# Patient Record
Sex: Female | Born: 1990 | Race: White | Hispanic: Yes | Marital: Married | Smoking: Former smoker
Health system: Southern US, Community
[De-identification: ages and names within clinical notes are randomized; demographics above are authoritative.]

## PROBLEM LIST (undated history)

## (undated) DIAGNOSIS — F329 Major depressive disorder, single episode, unspecified: Secondary | ICD-10-CM

## (undated) HISTORY — PX: APPENDECTOMY: SHX54

---
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
## 2018-05-27 ENCOUNTER — Encounter: Payer: Self-pay | Admitting: Emergency Medicine

## 2018-05-27 ENCOUNTER — Emergency Department
Admission: EM | Admit: 2018-05-27 | Discharge: 2018-05-27 | Disposition: A | Discharge status: Home / Self Care | Payer: BLUE CROSS/BLUE SHIELD | Attending: Emergency Medicine | Admitting: Emergency Medicine

## 2018-05-27 ENCOUNTER — Emergency Department: Payer: BLUE CROSS/BLUE SHIELD

## 2018-05-27 DIAGNOSIS — R0789 Other chest pain: Secondary | ICD-10-CM | POA: Diagnosis not present

## 2018-05-27 DIAGNOSIS — R079 Chest pain, unspecified: Secondary | ICD-10-CM | POA: Diagnosis not present

## 2018-05-27 DIAGNOSIS — M940 Chondrocostal junction syndrome [Tietze]: Secondary | ICD-10-CM

## 2018-05-27 LAB — BASIC METABOLIC PANEL
Anion gap: 8 (ref 5–15)
BUN: 10 mg/dL (ref 6–20)
CO2: 24 mmol/L (ref 22–32)
Calcium: 9.4 mg/dL (ref 8.9–10.3)
Chloride: 107 mmol/L (ref 98–111)
Creatinine, Ser: 0.8 mg/dL (ref 0.44–1.00)
GFR calc Af Amer: >60 mL/min (ref >60)
GFR calc non Af Amer: >60 mL/min (ref >60)
GLUCOSE: 92 mg/dL (ref 70–99)
POTASSIUM: 4.2 mmol/L (ref 3.5–5.1)
Sodium: 139 mmol/L (ref 135–145)

## 2018-05-27 LAB — CBC
HEMATOCRIT: 42.4 % (ref 35.0–47.0)
Hemoglobin: 14.8 g/dL (ref 12.0–16.0)
MCH: 30.8 pg (ref 26.0–34.0)
MCHC: 35 g/dL (ref 32.0–36.0)
MCV: 87.9 fL (ref 80.0–100.0)
Platelets: 234 10*3/uL (ref 150–440)
RBC: 4.82 MIL/uL (ref 3.80–5.20)
RDW: 13.1 % (ref 11.5–14.5)
WBC: 6.5 10*3/uL (ref 3.6–11.0)

## 2018-05-27 LAB — TROPONIN I: Troponin I: <0.03 ng/mL (ref <0.03)

## 2018-05-27 MED ORDER — DIAZEPAM 5 MG PO TABS: 5.0000 mg | ORAL_TABLET | Freq: Three times a day (TID) | ORAL | 0 refills | Status: DC | PRN

## 2018-05-27 MED ORDER — KETOROLAC TROMETHAMINE 30 MG/ML IJ SOLN
30.0000 mg | Freq: Once | INTRAMUSCULAR | Status: AC
  Administered 2018-05-27: 30 mg via INTRAVENOUS
  Filled 2018-05-27: qty 1

## 2018-05-27 MED ORDER — IBUPROFEN 800 MG PO TABS: 800.0000 mg | ORAL_TABLET | Freq: Three times a day (TID) | ORAL | 0 refills | Status: DC | PRN

## 2018-05-27 NOTE — ED Triage Notes (Signed)
Pt reports Sunday started with CP. Pt reports pain is constant with intermittent increases in severity. Pt describes the pain as shooting at times and pinching at other times and states that when you touch her chest it feels bruised. Pt also reports the pain radiates into both shoulders. Pt also reports some SOB with the pain. Pt reports ibuprofen relieves it a little but then it comes back.

## 2018-05-27 NOTE — ED Notes (Signed)
First Nurse Note: Patient complaining of chest pain since Sun. "on and off", indicates mid upper chest, describes it "as pinching unless I touch it".  Alert and oriented, NAD.  Denies injury.

## 2018-05-27 NOTE — ED Provider Notes (Signed)
[facility] Emergency Department Provider Note       Time seen: ----------------------------------------- 10:01 AM on 05/27/2018 -----------------------------------------   I have reviewed the triage vital signs and the nursing notes.  HISTORY   Chief Complaint Chest Pain and Shortness of Breath    HPI Karen Hanna is a 27 y.o. female with no significant past medical history who presents for chest pain.  Patient states pain is constant with intermittent increases in severity.  She describes it as shooting at times on the left side of her chest.  The chest wall is tender to touch, pain radiates between her shoulders.  History reviewed. No pertinent past medical history.  There are no active problems to display for this patient.   Past Surgical History:  Procedure Laterality Date  . APPENDECTOMY      Allergies Patient has no known allergies.  Social History Social History   Tobacco Use  . Smoking status: Not on file  Substance Use Topics  . Alcohol use: Not on file  . Drug use: Not on file    Review of Systems Constitutional: Negative for fever. Cardiovascular: Positive for chest pain Respiratory: Negative for shortness of breath. Gastrointestinal: Negative for abdominal pain, vomiting and diarrhea. Musculoskeletal: Negative for back pain. Skin: Negative for rash. Neurological: Negative for headaches, focal weakness or numbness.  All systems negative/normal/unremarkable except as stated in the HPI  ____________________________________________   PHYSICAL EXAM:  VITAL SIGNS: ED Triage Vitals  Enc Vitals Group     BP 05/27/18 0914 124/63     Pulse Rate 05/27/18 0914 62     Resp 05/27/18 0914 20     Temp 05/27/18 0914 98.7 F (37.1 C)     Temp Source 05/27/18 0914 Oral     SpO2 05/27/18 0914 100 %     Weight 05/27/18 0912 181 lb (82.1 kg)     Height 05/27/18 0912 5\' 5"  (1.651 m)     Head Circumference --      Peak  Flow --      Pain Score 05/27/18 0912 3     Pain Loc --      Pain Edu? --      Excl. in GC? --    Constitutional: Alert and oriented. Well appearing and in no distress. Eyes: Conjunctivae are normal. Normal extraocular movements. ENT   Head: Normocephalic and atraumatic.   Nose: No congestion/rhinnorhea.   Mouth/Throat: Mucous membranes are moist.   Neck: No stridor. Cardiovascular: Normal rate, regular rhythm. No murmurs, rubs, or gallops. Respiratory: Normal respiratory effort without tachypnea nor retractions. Breath sounds are clear and equal bilaterally. No wheezes/rales/rhonchi. Gastrointestinal: Soft and nontender. Normal bowel sounds Musculoskeletal: Nontender with normal range of motion in extremities. No lower extremity tenderness nor edema. Neurologic:  Normal speech and language. No gross focal neurologic deficits are appreciated.  Skin:  Skin is warm, dry and intact. No rash noted. Psychiatric: Mood and affect are normal. Speech and behavior are normal.  ____________________________________________  EKG: Interpreted by me.  Sinus rhythm rate 62 bpm, rightward axis, normal QT.  ____________________________________________  ED COURSE:  As part of my medical decision making, I reviewed the following data within the electronic MEDICAL RECORD NUMBER History obtained from family if available, nursing notes, old chart and ekg, as well as notes from prior ED visits. Patient presented for chest pain, we will assess with labs and imaging as indicated at this time.   Procedures ____________________________________________   LABS (pertinent positives/negatives)  Labs Reviewed  BASIC METABOLIC PANEL  CBC  TROPONIN I   RADIOLOGY Images were viewed by me  Chest x-ray IMPRESSION: Normal chest x-ray.  ____________________________________________  DIFFERENTIAL DIAGNOSIS   Costochondritis, GERD, anxiety, unstable angina unlikely  FINAL ASSESSMENT AND  PLAN  Chest wall pain   Plan: The patient had presented for atypical chest pain. Patient's labs do not reveal any acute process. Patient's imaging is also negative.  This is likely a combination of musculoskeletal pain and anxiety.  She is cleared for outpatient follow-up.   Ulice DashJohnathan E Analucia Hush, MD   Note: This note was generated in part or whole with voice recognition software. Voice recognition is usually quite accurate but there are transcription errors that can and very often do occur. I apologize for any typographical errors that were not detected and corrected.     Emily FilbertWilliams, Ursala Cressy E, MD 05/27/18 1104

## 2018-06-24 ENCOUNTER — Ambulatory Visit: Payer: Self-pay | Admitting: Nurse Practitioner

## 2018-06-30 ENCOUNTER — Encounter: Payer: Self-pay | Admitting: Nurse Practitioner

## 2018-06-30 ENCOUNTER — Ambulatory Visit (INDEPENDENT_AMBULATORY_CARE_PROVIDER_SITE_OTHER): Payer: BLUE CROSS/BLUE SHIELD | Admitting: Nurse Practitioner

## 2018-06-30 ENCOUNTER — Other Ambulatory Visit: Payer: Self-pay

## 2018-06-30 VITALS — BP 110/65 | HR 63 | Temp 98.9°F | Ht 65.0 in | Wt 181.2 lb

## 2018-06-30 DIAGNOSIS — F419 Anxiety disorder, unspecified: Secondary | ICD-10-CM | POA: Diagnosis not present

## 2018-06-30 DIAGNOSIS — F410 Panic disorder [episodic paroxysmal anxiety] without agoraphobia: Secondary | ICD-10-CM | POA: Diagnosis not present

## 2018-06-30 DIAGNOSIS — F32A Depression, unspecified: Secondary | ICD-10-CM

## 2018-06-30 DIAGNOSIS — F329 Major depressive disorder, single episode, unspecified: Secondary | ICD-10-CM | POA: Diagnosis not present

## 2018-06-30 DIAGNOSIS — Z7689 Persons encountering health services in other specified circumstances: Secondary | ICD-10-CM | POA: Diagnosis not present

## 2018-06-30 MED ORDER — HYDROXYZINE HCL 10 MG PO TABS: 10.0000 mg | ORAL_TABLET | Freq: Three times a day (TID) | ORAL | 0 refills | Status: DC | PRN

## 2018-06-30 MED ORDER — ESCITALOPRAM OXALATE 10 MG PO TABS: 10.0000 mg | ORAL_TABLET | Freq: Every day | ORAL | 2 refills | Status: DC

## 2018-06-30 NOTE — Patient Instructions (Addendum)
Karen Hanna,   Thank you for coming in to clinic today.  1.  START escitalopram (Lexapro) 10 mg tablet.  Take 1/2 tablet once daily for 7 days.  Then, increase to 1 full tablet once daily and continue.   2. PSYCHIATRY / THERAPY-COUSENLING PSYCH COUNSELING ONLY Self Referral: 1. [facility] [place] [facility]   Address: [address] [place], [place], [place] [address] Hours: Open today  9AM-7PM Phone: [phone]  2. Anell Barr [employer]  - [facility] Address: [address] [place], [place] [address] Phone: [phone]  [name], LCSW  [facility] Referral is placed, they will call you to schedule.  If you prefer a self referral counselor, decline their appointment.  Please schedule a follow-up appointment with Wilhelmina Mcardle, AGNP. Return in about 6 weeks (around 08/11/2018) for depression, anxiety.  If you have any other questions or concerns, please feel free to call the clinic or send a message through MyChart. You may also schedule an earlier appointment if necessary.  You will receive a survey after today's visit either digitally by e-mail or paper by [employer]. Your experiences and feedback matter to [place].  Please respond so we know how we are doing as we provide care for you.   Wilhelmina Mcardle, DNP, AGNP-BC Adult Gerontology Nurse Practitioner [facility], [facility]
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
## 2018-07-01 ENCOUNTER — Telehealth: Payer: Self-pay | Admitting: Nurse Practitioner

## 2018-07-01 NOTE — Telephone Encounter (Signed)
The pt called with concerns about how drowsy she is since taking the Hydroxyzine this morning. She did admit she didn't start the Escitalopram yet.

## 2018-07-01 NOTE — Telephone Encounter (Signed)
She should only take 1/2 tablet per dose if she took whole tab.  Also, did she take at bedtime or during the day.  It may be something she only takes at bedtime.

## 2018-07-01 NOTE — Telephone Encounter (Signed)
Pt has a question about hydroxyzine [phone]

## 2018-07-02 NOTE — Telephone Encounter (Signed)
Try calling patient got voice recording "mailbox is full can't accept any messages at this time."

## 2018-07-03 NOTE — Telephone Encounter (Signed)
Attempted to contact the pt, no answer.  

## 2018-07-18 ENCOUNTER — Telehealth: Payer: Self-pay | Admitting: Nurse Practitioner

## 2018-07-18 DIAGNOSIS — F419 Anxiety disorder, unspecified: Secondary | ICD-10-CM

## 2018-07-18 DIAGNOSIS — F410 Panic disorder [episodic paroxysmal anxiety] without agoraphobia: Secondary | ICD-10-CM

## 2018-07-18 DIAGNOSIS — F329 Major depressive disorder, single episode, unspecified: Secondary | ICD-10-CM

## 2018-07-18 MED ORDER — HYDROXYZINE HCL 10 MG PO TABS: 10.0000 mg | ORAL_TABLET | Freq: Three times a day (TID) | ORAL | 0 refills | Status: DC | PRN

## 2018-07-18 NOTE — Telephone Encounter (Signed)
Previous messages states it makes her drowsy and now patient is requesting hydroxyzine for Anxiety. As per patient the drowsiness last for only 2 days and then rest of the days she was fine.

## 2018-07-18 NOTE — Telephone Encounter (Signed)
Pt. Called requesting refill on hydroxyzine  Called into [facility]. Pt call back # is  [phone]

## 2018-07-18 NOTE — Telephone Encounter (Signed)
Covering inbox for Karen Hanna, AGPCNP-BC while she is out of office.  Reviewed chart. Agreed to refill Hydroxyzine 10mg  TID PRN - sent same rx as previously sent for #30 pills for now, up to 10 day supply, maybe longer if using less often. Will forward to PCP for future follow-up if agreeable may prescribe larger amount of Hydroxyzine if indicated or other change to therapy.  Saralyn Pilar, DO [facility] 07/18/2018, 5:38 PM

## 2018-07-22 ENCOUNTER — Other Ambulatory Visit: Payer: Self-pay | Admitting: Nurse Practitioner

## 2018-07-22 DIAGNOSIS — F419 Anxiety disorder, unspecified: Secondary | ICD-10-CM

## 2018-07-22 DIAGNOSIS — F329 Major depressive disorder, single episode, unspecified: Secondary | ICD-10-CM

## 2018-07-22 DIAGNOSIS — F410 Panic disorder [episodic paroxysmal anxiety] without agoraphobia: Secondary | ICD-10-CM

## 2018-07-22 DIAGNOSIS — F32A Depression, unspecified: Secondary | ICD-10-CM

## 2018-08-01 ENCOUNTER — Other Ambulatory Visit: Payer: Self-pay | Admitting: Nurse Practitioner

## 2018-08-01 DIAGNOSIS — F410 Panic disorder [episodic paroxysmal anxiety] without agoraphobia: Secondary | ICD-10-CM

## 2018-08-01 DIAGNOSIS — F329 Major depressive disorder, single episode, unspecified: Secondary | ICD-10-CM

## 2018-08-01 DIAGNOSIS — F419 Anxiety disorder, unspecified: Secondary | ICD-10-CM

## 2018-08-01 MED ORDER — HYDROXYZINE HCL 10 MG PO TABS: 10.0000 mg | ORAL_TABLET | Freq: Three times a day (TID) | ORAL | 0 refills | Status: DC | PRN

## 2018-08-01 NOTE — Telephone Encounter (Signed)
Pt called requesting refill on hydroxy ine called into [facility]

## 2018-08-13 ENCOUNTER — Encounter: Payer: Self-pay | Admitting: Nurse Practitioner

## 2018-08-13 ENCOUNTER — Other Ambulatory Visit: Payer: Self-pay

## 2018-08-13 ENCOUNTER — Ambulatory Visit (INDEPENDENT_AMBULATORY_CARE_PROVIDER_SITE_OTHER): Payer: BLUE CROSS/BLUE SHIELD | Admitting: Nurse Practitioner

## 2018-08-13 DIAGNOSIS — F410 Panic disorder [episodic paroxysmal anxiety] without agoraphobia: Secondary | ICD-10-CM | POA: Diagnosis not present

## 2018-08-13 DIAGNOSIS — F329 Major depressive disorder, single episode, unspecified: Secondary | ICD-10-CM | POA: Diagnosis not present

## 2018-08-13 DIAGNOSIS — F419 Anxiety disorder, unspecified: Secondary | ICD-10-CM

## 2018-08-13 DIAGNOSIS — F32A Depression, unspecified: Secondary | ICD-10-CM

## 2018-08-13 MED ORDER — HYDROXYZINE HCL 25 MG PO TABS: 12.5000 mg | ORAL_TABLET | Freq: Three times a day (TID) | ORAL | 1 refills | Status: DC | PRN

## 2018-08-13 MED ORDER — ESCITALOPRAM OXALATE 20 MG PO TABS: 20.0000 mg | ORAL_TABLET | Freq: Every day | ORAL | 2 refills | Status: DC

## 2018-08-13 NOTE — Progress Notes (Signed)
Subjective:    Patient ID: Karen Hanna, female    DOB: 1991/07/23, 27 y.o.   MRN: [id number]  Karen Hanna is a 27 y.o. female presenting on 08/13/2018 for Anxiety (depression)   HPI Anxiety Is having some "random outbursts of 'I wish I were dead thoughts' that just cross my head." Feels less spurts of manic energy and is having difficulty focusing on things.  Feels she may have ADHD.  Had been told by her teachers in past that she needed to have  - Picking skin (fingers, lips, toes) and doesn't notice still she is bleeding or hurting. - Hydroxyzine has helped some with anxiety, but feels like she needs more.  - Is working on self-soothing mechanisms. - Much less irritation  GAD 7 : Generalized Anxiety Score 08/13/2018 06/30/2018  Nervous, Anxious, on Edge 2 3  Control/stop worrying 2 2  Worry too much - different things 1 2  Trouble relaxing 2 2  Restless 2 2  Easily annoyed or irritable 0 2  Afraid - awful might happen 1 3  Total GAD 7 Score 10 16  Anxiety Difficulty Somewhat difficult Very difficult     Depression screen [facility] 2/9 08/13/2018 06/30/2018  Decreased Interest 1 2  Down, Depressed, Hopeless 1 2  PHQ - 2 Score 2 4  Altered sleeping 2 3  Tired, decreased energy 2 3  Change in appetite 2 3  Feeling bad or failure about yourself  1 1  Trouble concentrating 3 1  Moving slowly or fidgety/restless 1 3  Suicidal thoughts 2 1  PHQ-9 Score 15 19  Difficult doing work/chores Somewhat difficult Very difficult    Social History   Tobacco Use  . Smoking status: Former Smoker    Years: 11.00    Last attempt to quit: 01/28/2018    Years since quitting: 0.5  . Smokeless tobacco: Never Used  Substance Use Topics  . Alcohol use: Yes    Alcohol/week: 1.0 standard drinks    Types: 1 Glasses of wine per week  . Drug use: Never    Review of Systems Per HPI unless specifically indicated above     Objective:    BP 115/60 (BP Location: Right Arm,  Patient Position: Sitting, Cuff Size: Normal)   Pulse 62   Temp 98.3 F (36.8 C) (Oral)   Ht 5\' 5"  (1.651 m)   Wt 179 lb 12.8 oz (81.6 kg)   BMI 29.92 kg/m   Wt Readings from Last 3 Encounters:  08/13/18 179 lb 12.8 oz (81.6 kg)  06/30/18 181 lb 3.2 oz (82.2 kg)  05/27/18 181 lb (82.1 kg)    Physical Exam  Constitutional: She is oriented to person, place, and time. She appears well-developed and well-nourished. No distress.  HENT:  Head: Normocephalic and atraumatic.  Cardiovascular: Normal rate, regular rhythm, S1 normal, S2 normal, normal heart sounds and intact distal pulses.  Pulmonary/Chest: Effort normal and breath sounds normal. No respiratory distress.  Neurological: She is alert and oriented to person, place, and time.  Skin: Skin is warm and dry.  Psychiatric: Her behavior is normal. Her mood appears anxious. Her speech is rapid and/or pressured.  Vitals reviewed.   Results for orders placed or performed during the hospital encounter of 05/27/18  Basic metabolic panel  Result Value Ref Range   Sodium 139 135 - 145 mmol/L   Potassium 4.2 3.5 - 5.1 mmol/L   Chloride 107 98 - 111 mmol/L   CO2 24  22 - 32 mmol/L   Glucose, Bld 92 70 - 99 mg/dL   BUN 10 6 - 20 mg/dL   Creatinine, Ser 2.950.80 0.44 - 1.00 mg/dL   Calcium 9.4 8.9 - 62.110.3 mg/dL   GFR calc non Af Amer >60 >60 mL/min   GFR calc Af Amer >60 >60 mL/min   Anion gap 8 5 - 15  CBC  Result Value Ref Range   WBC 6.5 3.6 - 11.0 K/uL   RBC 4.82 3.80 - 5.20 MIL/uL   Hemoglobin 14.8 12.0 - 16.0 g/dL   HCT 30.842.4 65.735.0 - 84.647.0 %   MCV 87.9 80.0 - 100.0 fL   MCH 30.8 26.0 - 34.0 pg   MCHC 35.0 32.0 - 36.0 g/dL   RDW 96.213.1 95.211.5 - 84.114.5 %   Platelets 234 150 - 440 K/uL  Troponin I  Result Value Ref Range   Troponin I <0.03 <0.03 ng/mL      Assessment & Plan:   Problem List Items Addressed This Visit    None    Visit Diagnoses    Panic       Relevant Medications   hydrOXYzine (ATARAX/VISTARIL) 25 MG tablet    escitalopram (LEXAPRO) 20 MG tablet   Anxiety and depression       Relevant Medications   hydrOXYzine (ATARAX/VISTARIL) 25 MG tablet   escitalopram (LEXAPRO) 20 MG tablet      # Anxiety and depression, panic disorder Patient with acute anxiety and panic, disrupting quality of life and relationships in past, now improving.  Patient also has concern for ADHD and would like testing now that she has less anxiety and decreased attention..  Plan: 1. Provided resources for ADHD testing. 2. Increase lexapro to 20 mg once daily 3. Take hydroxyzine 1/2-1 tab tid prn anxiety and sleep. 4. Follow up 2 months.  Meds ordered this encounter  Medications  . hydrOXYzine (ATARAX/VISTARIL) 25 MG tablet    Sig: Take 0.5-1 tablets (12.5-25 mg total) by mouth 3 (three) times daily as needed for anxiety.    Dispense:  60 tablet    Refill:  1    Order Specific Question:   Supervising Provider    Answer:   Smitty CordsKARAMALEGOS, ALEXANDER J [[id number]]  . escitalopram (LEXAPRO) 20 MG tablet    Sig: Take 1 tablet (20 mg total) by mouth daily.    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  Wilhelmina McardleLauren Sharlene Mccluskey, DNP, AGPCNP-BC Adult Gerontology Primary Care Nurse Practitioner [facility] 08/13/2018, 9:11 AM

## 2018-08-13 NOTE — Patient Instructions (Addendum)
Karen LungerPaola A Barrezueta Hanna,   Thank you for coming in to clinic today.  1. You can get your ADHD testing done at any of the following locations: 1. Drema BalzarineBruce R Thompson PHD, local Psychologist  [address]  [place], [place] [address]  [phone]  2. [facility]  [address]   [place], [place] [address]   Phone [phone]   3. [facility] (will also do medication management)  Ground Floor of the [facility] just off the lobby  [address]  [place], [place] [address]  Phone: [phone]   2. INCREASE lexapro to 20 mg once daily.  Take with food in morning.   3. CHANGE hydroxyzine to 25 mg tablet.  Take 1/2 - 1 tablet up to three times daily as needed for anxiety and/or sleep at bedtime.  Please schedule a follow-up appointment with Wilhelmina McardleLauren Luismiguel Lamere, AGNP. Return in about 2 months (around 10/13/2018).  If you have any other questions or concerns, please feel free to call the clinic or send a message through MyChart. You may also schedule an earlier appointment if necessary.  You will receive a survey after today's visit either digitally by e-mail or paper by [employer] mail. Your experiences and feedback matter to us.  Please respond so we know how we are doing as we provide care for you.   Wilhelmina McardleLauren Mayda Shippee, DNP, AGNP-BC Adult Gerontology Nurse Practitioner [facility], [facility]

## 2018-08-21 ENCOUNTER — Encounter: Payer: Self-pay | Admitting: Nurse Practitioner

## 2018-09-07 ENCOUNTER — Other Ambulatory Visit: Payer: Self-pay | Admitting: Nurse Practitioner

## 2018-09-07 DIAGNOSIS — F329 Major depressive disorder, single episode, unspecified: Secondary | ICD-10-CM

## 2018-09-07 DIAGNOSIS — F32A Depression, unspecified: Secondary | ICD-10-CM

## 2018-09-07 DIAGNOSIS — F419 Anxiety disorder, unspecified: Secondary | ICD-10-CM

## 2018-09-07 DIAGNOSIS — F410 Panic disorder [episodic paroxysmal anxiety] without agoraphobia: Secondary | ICD-10-CM

## 2018-09-16 ENCOUNTER — Other Ambulatory Visit: Payer: Self-pay | Admitting: Nurse Practitioner

## 2018-09-16 DIAGNOSIS — F410 Panic disorder [episodic paroxysmal anxiety] without agoraphobia: Secondary | ICD-10-CM

## 2018-09-16 DIAGNOSIS — F419 Anxiety disorder, unspecified: Secondary | ICD-10-CM

## 2018-09-16 DIAGNOSIS — F329 Major depressive disorder, single episode, unspecified: Secondary | ICD-10-CM

## 2018-09-16 NOTE — Telephone Encounter (Signed)
Refilled on 09/08/2018.  Patient should call her pharmacy.

## 2018-09-16 NOTE — Telephone Encounter (Signed)
Pt needs a refill on [facility]apro [name]

## 2018-09-18 NOTE — Telephone Encounter (Signed)
Left message for Rx being send and patient can call back for detail.

## 2018-10-13 ENCOUNTER — Ambulatory Visit: Payer: BLUE CROSS/BLUE SHIELD | Admitting: Nurse Practitioner

## 2018-10-22 ENCOUNTER — Other Ambulatory Visit: Payer: Self-pay | Admitting: Nurse Practitioner

## 2018-10-22 DIAGNOSIS — F419 Anxiety disorder, unspecified: Secondary | ICD-10-CM

## 2018-10-22 DIAGNOSIS — F329 Major depressive disorder, single episode, unspecified: Secondary | ICD-10-CM

## 2018-10-22 DIAGNOSIS — F410 Panic disorder [episodic paroxysmal anxiety] without agoraphobia: Secondary | ICD-10-CM

## 2018-10-22 NOTE — Telephone Encounter (Signed)
Refill is not due. Last sent on 09/07/2009 for 90-day supply.  Patient also missed her last appointment and will need to reschedule.

## 2018-10-30 ENCOUNTER — Encounter: Payer: Self-pay | Admitting: Nurse Practitioner

## 2018-10-30 ENCOUNTER — Other Ambulatory Visit: Payer: Self-pay

## 2018-10-30 ENCOUNTER — Ambulatory Visit (INDEPENDENT_AMBULATORY_CARE_PROVIDER_SITE_OTHER): Payer: BLUE CROSS/BLUE SHIELD | Admitting: Nurse Practitioner

## 2018-10-30 VITALS — BP 117/64 | HR 63 | Temp 98.4°F | Resp 16 | Ht 65.0 in | Wt 190.6 lb

## 2018-10-30 DIAGNOSIS — R6889 Other general symptoms and signs: Secondary | ICD-10-CM | POA: Diagnosis not present

## 2018-10-30 DIAGNOSIS — R6883 Chills (without fever): Secondary | ICD-10-CM | POA: Diagnosis not present

## 2018-10-30 DIAGNOSIS — R000 Tachycardia, unspecified: Secondary | ICD-10-CM

## 2018-10-30 DIAGNOSIS — J40 Bronchitis, not specified as acute or chronic: Secondary | ICD-10-CM | POA: Diagnosis not present

## 2018-10-30 LAB — POCT INFLUENZA A/B
Influenza A, POC: NEGATIVE
Influenza B, POC: NEGATIVE

## 2018-10-30 MED ORDER — ALBUTEROL SULFATE HFA 108 (90 BASE) MCG/ACT IN AERS: 1.0000 | INHALATION_SPRAY | Freq: Four times a day (QID) | RESPIRATORY_TRACT | 2 refills | Status: DC | PRN

## 2018-10-30 NOTE — Patient Instructions (Signed)
Karen Hanna,   Thank you for coming in to clinic today.  1. Your flu test was NEGATIVE.  It is possible you can still have the flu with a negative test, otherwise it could be a different virus causing your symptoms. - Wash hands and cover cough very well to avoid spread of infection - For symptom control:      - Take Ibuprofen / Advil 400-600mg  every 6-8 hours as needed for fever / muscle aches.  You may also take Tylenol 500-1000mg  per dose every 6-8 hours or 3 times a day.  You can alternate dosing and take both in the same day.      - Do not take more than 3,000 mg acetaminophen (Tylenol) in a day.      - START albuterol 1-2 puffs every 4-6 hours as needed.      - Start OTC Mucinex-DM for cough and congestion for up to 7 days.  If taking cold and flu do not take mucinex DM. - Improve hydration with plenty of clear fluids.  Drink up to 8 glasses of water / fluids each day.  If significant worsening with poor fluid intake, worsening fever, difficulty breathing due to coughing, worsening body aches, weakness, or other more concerning symptoms difficulty breathing you can seek treatment at Emergency Department. If your flu symptoms have improved and then get worse several days to a week later with concerns for bronchitis, productive cough, fever, and chills we may need to check for possible pneumonia that can occur after the flu.  Please schedule a follow-up appointment with Wilhelmina Mcardle, AGNP in 1-2 weeks as needed if worsening from Flu / Bronchitis.  If you have any other questions or concerns, please feel free to call the clinic or send a message through MyChart. You may also schedule an earlier appointment if necessary.  You will receive a survey after today's visit either digitally by e-mail or paper by [employer]. Your experiences and feedback matter to [place].  Please respond so we know how we are doing as we provide care for you.   Wilhelmina Mcardle, DNP, AGNP-BC Adult  Gerontology Nurse Practitioner [facility], [facility]

## 2018-10-30 NOTE — Progress Notes (Signed)
Subjective:    Patient ID: Karen Hanna, female    DOB: [birth year], 28 y.o.   MRN: [id number]  Karen Hanna is a 28 y.o. female presenting on 10/30/2018 for Influenza (sore throat, cough, chills, bodyache onset 4 days coworkers tested positive for flu)   HPI Flu-like symptoms Started feeling poorly Saturday (4 days ago).  Then went to work Monday with sneezing, coughing and was sent home. Since then, has had night sweats, chills, hot flashes cyclically.  Now having body aches, fatigue/malaise, nasal congestion.  Feels some chest congestion.  Has had mild nausea, but is able to keep food/drink on stomach.  No vomiting.  States she feels difficulty breathing with chest pressure at night. - Has had positive flu contacts.   Social History   Tobacco Use  . Smoking status: Former Smoker    Years: 11.00    Last attempt to quit: 01/28/2018    Years since quitting: 0.7  . Smokeless tobacco: Never Used  Substance Use Topics  . Alcohol use: Yes    Alcohol/week: 1.0 standard drinks    Types: 1 Glasses of wine per week  . Drug use: Never    Review of Systems Per HPI unless specifically indicated above     Objective:    BP 117/64   Pulse 63   Temp 98.4 F (36.9 C) (Oral)   Resp 16   Ht 5\' 5"  (1.651 m)   Wt 190 lb 9.6 oz (86.5 kg)   SpO2 100%   BMI 31.72 kg/m   Wt Readings from Last 3 Encounters:  10/30/18 190 lb 9.6 oz (86.5 kg)  08/13/18 179 lb 12.8 oz (81.6 kg)  06/30/18 181 lb 3.2 oz (82.2 kg)    Physical Exam Vitals signs reviewed.  Constitutional:      General: She is not in acute distress.    Appearance: She is well-developed. She is ill-appearing. She is not diaphoretic.  HENT:     Head: Normocephalic and atraumatic.     Right Ear: Hearing, tympanic membrane, ear canal and external ear normal.     Left Ear: Hearing, tympanic membrane, ear canal and external ear normal.     Nose: Mucosal edema and rhinorrhea present.     Right Sinus: No maxillary  sinus tenderness or frontal sinus tenderness.     Left Sinus: No maxillary sinus tenderness or frontal sinus tenderness.     Mouth/Throat:     Lips: Pink.     Mouth: Mucous membranes are moist.     Pharynx: Oropharynx is clear. No pharyngeal swelling, oropharyngeal exudate or posterior oropharyngeal erythema.     Tonsils: Swelling: 1+ on the right. 1+ on the left.  Cardiovascular:     Rate and Rhythm: Normal rate and regular rhythm.     Pulses:          Radial pulses are 2+ on the right side and 2+ on the left side.       Posterior tibial pulses are 1+ on the right side and 1+ on the left side.     Heart sounds: Normal heart sounds, S1 normal and S2 normal.  Pulmonary:     Effort: Pulmonary effort is normal. No respiratory distress.     Breath sounds: Normal air entry. Wheezing present.  Musculoskeletal:     Right lower leg: No edema.     Left lower leg: No edema.  Skin:    General: Skin is warm and dry.  Capillary Refill: Capillary refill takes less than 2 seconds.  Neurological:     Mental Status: She is alert and oriented to person, place, and time.  Psychiatric:        Attention and Perception: Attention normal.        Mood and Affect: Mood and affect normal.        Behavior: Behavior normal. Behavior is cooperative.        Thought Content: Thought content normal.        Judgment: Judgment normal.     Results for orders placed or performed during the hospital encounter of 05/27/18  Basic metabolic panel  Result Value Ref Range   Sodium 139 135 - 145 mmol/L   Potassium 4.2 3.5 - 5.1 mmol/L   Chloride 107 98 - 111 mmol/L   CO2 24 22 - 32 mmol/L   Glucose, Bld 92 70 - 99 mg/dL   BUN 10 6 - 20 mg/dL   Creatinine, Ser 1.610.80 0.44 - 1.00 mg/dL   Calcium 9.4 8.9 - 09.610.3 mg/dL   GFR calc non Af Amer >60 >60 mL/min   GFR calc Af Amer >60 >60 mL/min   Anion gap 8 5 - 15  CBC  Result Value Ref Range   WBC 6.5 3.6 - 11.0 K/uL   RBC 4.82 3.80 - 5.20 MIL/uL   Hemoglobin 14.8  12.0 - 16.0 g/dL   HCT 04.542.4 40.935.0 - 81.147.0 %   MCV 87.9 80.0 - 100.0 fL   MCH 30.8 26.0 - 34.0 pg   MCHC 35.0 32.0 - 36.0 g/dL   RDW 91.413.1 78.211.5 - 95.614.5 %   Platelets 234 150 - 440 K/uL  Troponin I  Result Value Ref Range   Troponin I <0.03 <0.03 ng/mL      Assessment & Plan:   Problem List Items Addressed This Visit    None    Visit Diagnoses    Chills    -  Primary   Relevant Orders   POCT Influenza A/B (Completed)   Bronchitis       Relevant Medications   albuterol (PROVENTIL HFA;VENTOLIN HFA) 108 (90 Base) MCG/ACT inhaler   Flu-like symptoms       Relevant Medications   albuterol (PROVENTIL HFA;VENTOLIN HFA) 108 (90 Base) MCG/ACT inhaler     Clinically diagnosed influenza without flu test today given time from symptom onset and known exposure to positive influenza. - Duration x 4 days, without complication. Tolerating PO and well hydrated - No other focal findings of infection today - did not receive influenza vaccine this season  Plan: 1. Not able to start antiviral therapy due to time from onset of symptoms.  Now at point that symptoms will begin resolving. - START albuterol for shortness of breath, chest discomfort with breathing - likely bronchitis. 2. Supportive care as advised with NSAID / Tylenol PRN fever/myalgias, improve hydration, may take OTC Cold/Flu meds 3. Return criteria given if significant worsening, consider post-influenza complications, otherwise follow-up if needed    Meds ordered this encounter  Medications  . albuterol (PROVENTIL HFA;VENTOLIN HFA) 108 (90 Base) MCG/ACT inhaler    Sig: Inhale 1-2 puffs into the lungs every 6 (six) hours as needed for wheezing or shortness of breath.    Dispense:  1 Inhaler    Refill:  2    Product selection permitted for insurance/patient brand preference    Order Specific Question:   Supervising Provider    Answer:   Smitty CordsKARAMALEGOS, ALEXANDER J [[id number]]  Follow up plan: Follow-up 1-2 weeks prn.  Concern for  monitoring for bronchits, pneumonia.  Wilhelmina Mcardle, DNP, AGPCNP-BC Adult Gerontology Primary Care Nurse Practitioner [facility] 10/30/2018, 8:41 AM

## 2018-10-31 ENCOUNTER — Encounter: Payer: Self-pay | Admitting: Nurse Practitioner

## 2018-10-31 NOTE — Progress Notes (Signed)
Duplicate vist.

## 2018-12-12 ENCOUNTER — Other Ambulatory Visit: Payer: Self-pay | Admitting: Nurse Practitioner

## 2018-12-12 DIAGNOSIS — F32A Depression, unspecified: Secondary | ICD-10-CM

## 2018-12-12 DIAGNOSIS — F419 Anxiety disorder, unspecified: Secondary | ICD-10-CM

## 2018-12-12 DIAGNOSIS — F410 Panic disorder [episodic paroxysmal anxiety] without agoraphobia: Secondary | ICD-10-CM

## 2018-12-12 DIAGNOSIS — F329 Major depressive disorder, single episode, unspecified: Secondary | ICD-10-CM

## 2018-12-12 NOTE — Telephone Encounter (Signed)
Pharmacy requesting refills. Thanks!  

## 2019-01-09 ENCOUNTER — Other Ambulatory Visit: Payer: Self-pay | Admitting: Nurse Practitioner

## 2019-01-09 DIAGNOSIS — F410 Panic disorder [episodic paroxysmal anxiety] without agoraphobia: Secondary | ICD-10-CM

## 2019-01-09 DIAGNOSIS — F329 Major depressive disorder, single episode, unspecified: Secondary | ICD-10-CM

## 2019-01-09 DIAGNOSIS — F419 Anxiety disorder, unspecified: Secondary | ICD-10-CM

## 2019-01-12 ENCOUNTER — Other Ambulatory Visit: Payer: Self-pay

## 2019-01-12 ENCOUNTER — Ambulatory Visit: Payer: BLUE CROSS/BLUE SHIELD | Admitting: Nurse Practitioner

## 2019-01-14 ENCOUNTER — Encounter: Payer: Self-pay | Admitting: Nurse Practitioner

## 2019-01-14 ENCOUNTER — Other Ambulatory Visit: Payer: Self-pay

## 2019-01-14 ENCOUNTER — Ambulatory Visit (INDEPENDENT_AMBULATORY_CARE_PROVIDER_SITE_OTHER): Payer: BLUE CROSS/BLUE SHIELD | Admitting: Nurse Practitioner

## 2019-01-14 DIAGNOSIS — F5104 Psychophysiologic insomnia: Secondary | ICD-10-CM

## 2019-01-14 DIAGNOSIS — F329 Major depressive disorder, single episode, unspecified: Secondary | ICD-10-CM

## 2019-01-14 DIAGNOSIS — F410 Panic disorder [episodic paroxysmal anxiety] without agoraphobia: Secondary | ICD-10-CM

## 2019-01-14 DIAGNOSIS — F419 Anxiety disorder, unspecified: Secondary | ICD-10-CM

## 2019-01-14 DIAGNOSIS — F32A Depression, unspecified: Secondary | ICD-10-CM

## 2019-01-14 MED ORDER — TRAZODONE HCL 50 MG PO TABS: 25.0000 mg | ORAL_TABLET | Freq: Every evening | ORAL | 1 refills | Status: DC | PRN

## 2019-01-14 MED ORDER — ESCITALOPRAM OXALATE 20 MG PO TABS: 20.0000 mg | ORAL_TABLET | Freq: Every day | ORAL | 5 refills | Status: DC

## 2019-01-14 NOTE — Progress Notes (Signed)
Telemedicine Encounter: Disclosed to patient at start of encounter that we will provide appropriate telemedicine services.  Patient consents to be treated via phone prior to discussion. - Patient is at her home and is accessed via telephone. - Services are provided by Wilhelmina Mcardle from [facility].   Subjective:    Patient ID: Karen Hanna, female    DOB: June 02, 1991, 28 y.o.   MRN: [id number]  Zanovia Ugalde is a 28 y.o. female presenting on 01/14/2019 for Anxiety  HPI Anxiety Patient was prescribed escitalopram 20 mg daily with good response.  Notes no irritability or getting down.  If she misses it she notes the next day is worse with irritable and lack of motivation, hard to control emotions. - Patient has not been out of medications - Patient has been home and has been different - coping skills - has a harder time focusing to write.   - Patient is still having a really hard time sleeping. - Only uses hydroxyzine when she has "freak out" - still helps "a little" - SI: blames this on missing meds.  Has episode of missing meds.  Thinks of ways of ending her life.  Thought process of planning.  Never has fully developed a plan.    Depression screen [facility] 2/9 01/14/2019 10/30/2018 08/13/2018 06/30/2018  Decreased Interest 2 2 1 2   Down, Depressed, Hopeless 3 2 1 2   PHQ - 2 Score 5 4 2 4   Altered sleeping 3 3 2 3   Tired, decreased energy 3 2 2 3   Change in appetite 3 3 2 3   Feeling bad or failure about yourself  3 2 1 1   Trouble concentrating 3 1 3 1   Moving slowly or fidgety/restless 0 2 1 3   Suicidal thoughts 3 2 2 1   PHQ-9 Score 23 19 15 19   Difficult doing work/chores Somewhat difficult Somewhat difficult Somewhat difficult Very difficult    GAD 7 : Generalized Anxiety Score 01/14/2019 10/30/2018 08/13/2018 06/30/2018  Nervous, Anxious, on Edge 1 3 2 3   Control/stop worrying 1 2 2 2   Worry too much - different things 1 3 1 2   Trouble relaxing 1 2  2 2   Restless 3 2 2 2   Easily annoyed or irritable 1 2 0 2  Afraid - awful might happen 1 2 1 3   Total GAD 7 Score 9 16 10 16   Anxiety Difficulty Very difficult Somewhat difficult Somewhat difficult Very difficult    Social History   Tobacco Use  . Smoking status: Former Smoker    Years: 11.00    Last attempt to quit: 01/28/2018    Years since quitting: 0.9  . Smokeless tobacco: Never Used  Substance Use Topics  . Alcohol use: Yes    Alcohol/week: 1.0 standard drinks    Types: 1 Glasses of wine per week  . Drug use: Never   Review of Systems Per HPI unless specifically indicated above    Objective:    There were no vitals taken for this visit.   Wt Readings from Last 3 Encounters:  10/30/18 190 lb 9.6 oz (86.5 kg)  10/30/18 190 lb 9.6 oz (86.5 kg)  08/13/18 179 lb 12.8 oz (81.6 kg)    Physical Exam Patient remotely monitored.  Verbal communication appropriate.  Cognition normal.   Results for orders placed or performed in visit on 10/30/18  POCT Influenza A/B  Result Value Ref Range   Influenza A, POC Negative Negative   Influenza  B, POC Negative Negative      Assessment & Plan:   Problem List Items Addressed This Visit    None    Visit Diagnoses    Anxiety and depression    -  Primary   Relevant Medications   traZODone (DESYREL) 50 MG tablet   escitalopram (LEXAPRO) 20 MG tablet   Psychophysiological insomnia       Relevant Medications   traZODone (DESYREL) 50 MG tablet   Panic       Relevant Medications   traZODone (DESYREL) 50 MG tablet   escitalopram (LEXAPRO) 20 MG tablet      Acute worsening of anxiety and insomnia due to lifestyle changes and loss of previously adequate coping skills due to Covid-19 social distancing and staying home with family more. Currently bothered most by insomnia, SI.  SI is present without plans.  Patient has no current / active SI, but does have thoughts regularly.  Plan: 1. Patient continues with escitalopram 20 mg tab  once daily and tolerates fairly well.   2. Needs assistance with insomnia - START trazodone 25-50 mg at bedtime prn sleep.  May need regular dosing for augmentation of anxiety and is discussed with patient. 3. Verbal agreement obtained by patient that if SI becomes more frequent, starts formulating a complete plan she will seek help with trusted family member, law enforcement, ER, or [facility]. 4. Reviewed non-pharm stress management. Start with alternative relaxation technique to help access coping skill of writing. 5. Follow-up 4 weeks for insomnia management, close follow-up of anxiety with SI.    Meds ordered this encounter  Medications  . traZODone (DESYREL) 50 MG tablet    Sig: Take 0.5-1 tablets (25-50 mg total) by mouth at bedtime as needed for sleep.    Dispense:  30 tablet    Refill:  1    Order Specific Question:   Supervising Provider    Answer:   Smitty CordsKARAMALEGOS, ALEXANDER J [[id number]]  . escitalopram (LEXAPRO) 20 MG tablet    Sig: Take 1 tablet (20 mg total) by mouth daily.    Dispense:  30 tablet    Refill:  5    Order Specific Question:   Supervising Provider    Answer:   Smitty CordsKARAMALEGOS, ALEXANDER J [[id number]]   - Time spent in direct consultation with patient via telemedicine about above concerns: 10 minutes  Follow up plan: Return in about 4 weeks (around 02/11/2019) for anxiety, depression, insomnia.  Wilhelmina McardleLauren Mckynzi Cammon, DNP, AGPCNP-BC Adult Gerontology Primary Care Nurse Practitioner [facility] 01/14/2019, 8:07 AM

## 2019-02-11 ENCOUNTER — Ambulatory Visit: Payer: BLUE CROSS/BLUE SHIELD | Admitting: Family Medicine

## 2019-02-11 ENCOUNTER — Other Ambulatory Visit: Payer: Self-pay

## 2019-03-16 ENCOUNTER — Other Ambulatory Visit: Payer: Self-pay | Admitting: Nurse Practitioner

## 2019-03-16 DIAGNOSIS — F419 Anxiety disorder, unspecified: Secondary | ICD-10-CM

## 2019-03-16 DIAGNOSIS — F5104 Psychophysiologic insomnia: Secondary | ICD-10-CM

## 2019-03-16 DIAGNOSIS — F329 Major depressive disorder, single episode, unspecified: Secondary | ICD-10-CM

## 2019-03-30 ENCOUNTER — Other Ambulatory Visit: Payer: Self-pay | Admitting: Family Medicine

## 2019-03-30 DIAGNOSIS — F419 Anxiety disorder, unspecified: Secondary | ICD-10-CM

## 2019-03-30 DIAGNOSIS — F329 Major depressive disorder, single episode, unspecified: Secondary | ICD-10-CM

## 2019-03-30 DIAGNOSIS — F5104 Psychophysiologic insomnia: Secondary | ICD-10-CM

## 2019-03-31 MED ORDER — TRAZODONE HCL 50 MG PO TABS: 25.0000 mg | ORAL_TABLET | Freq: Every evening | ORAL | 0 refills | Status: DC | PRN

## 2019-03-31 NOTE — Addendum Note (Signed)
Addended by: Olin Hauser on: 03/31/2019 12:56 AM   Modules accepted: Orders

## 2019-05-14 ENCOUNTER — Other Ambulatory Visit: Payer: Self-pay | Admitting: Nurse Practitioner

## 2019-05-14 DIAGNOSIS — F419 Anxiety disorder, unspecified: Secondary | ICD-10-CM

## 2019-05-14 DIAGNOSIS — F410 Panic disorder [episodic paroxysmal anxiety] without agoraphobia: Secondary | ICD-10-CM

## 2019-05-14 DIAGNOSIS — F329 Major depressive disorder, single episode, unspecified: Secondary | ICD-10-CM

## 2019-06-10 ENCOUNTER — Encounter: Payer: BLUE CROSS/BLUE SHIELD | Admitting: Nurse Practitioner

## 2019-06-25 ENCOUNTER — Encounter: Payer: Self-pay | Admitting: Nurse Practitioner

## 2019-06-25 ENCOUNTER — Other Ambulatory Visit: Payer: Self-pay

## 2019-06-25 ENCOUNTER — Other Ambulatory Visit (HOSPITAL_COMMUNITY)
Admission: RE | Admit: 2019-06-25 | Discharge: 2019-06-25 | Disposition: A | Discharge status: Home / Self Care | Payer: BC Managed Care – PPO | Source: Ambulatory Visit | Attending: Nurse Practitioner | Admitting: Nurse Practitioner

## 2019-06-25 ENCOUNTER — Ambulatory Visit (INDEPENDENT_AMBULATORY_CARE_PROVIDER_SITE_OTHER): Payer: BC Managed Care – PPO | Admitting: Nurse Practitioner

## 2019-06-25 VITALS — BP 109/51 | HR 63 | Ht 65.0 in | Wt 194.0 lb

## 2019-06-25 DIAGNOSIS — F329 Major depressive disorder, single episode, unspecified: Secondary | ICD-10-CM | POA: Insufficient documentation

## 2019-06-25 DIAGNOSIS — Z124 Encounter for screening for malignant neoplasm of cervix: Secondary | ICD-10-CM | POA: Diagnosis not present

## 2019-06-25 DIAGNOSIS — Z113 Encounter for screening for infections with a predominantly sexual mode of transmission: Secondary | ICD-10-CM | POA: Diagnosis not present

## 2019-06-25 DIAGNOSIS — Z114 Encounter for screening for human immunodeficiency virus [HIV]: Secondary | ICD-10-CM | POA: Diagnosis not present

## 2019-06-25 DIAGNOSIS — R102 Pelvic and perineal pain: Secondary | ICD-10-CM

## 2019-06-25 DIAGNOSIS — Z23 Encounter for immunization: Secondary | ICD-10-CM

## 2019-06-25 DIAGNOSIS — F32A Depression, unspecified: Secondary | ICD-10-CM | POA: Insufficient documentation

## 2019-06-25 DIAGNOSIS — F320 Major depressive disorder, single episode, mild: Secondary | ICD-10-CM

## 2019-06-25 DIAGNOSIS — Z0000 Encounter for general adult medical examination without abnormal findings: Secondary | ICD-10-CM | POA: Diagnosis not present

## 2019-06-25 HISTORY — DX: Depression, unspecified: F32.A

## 2019-06-25 LAB — POCT URINE PREGNANCY: Preg Test, Ur: NEGATIVE

## 2019-06-25 NOTE — Progress Notes (Signed)
Subjective:    Patient ID: Karen Hanna, female    DOB: 1991/02/10, 29 y.o.   MRN: [id number]  Karen Hanna is a 28 y.o. female presenting on 06/25/2019 for Annual Exam   HPI Annual Physical Exam Patient has been feeling well in general.  They have no acute concerns today. Sleeps 5-8 hours per night occasionally interrupted.  Trazodone causing daytime drowsiness.  HEALTH MAINTENANCE: Weight/BMI: Fluctuations - has reduced weight again from increase after Covid  Physical activity: Increasing with gym - plan for 4-5 days per week Diet: Improved recently to meal prep for lunch (Protein and veg), Protein at dinner with carb, vegetables, Fruits for sweets Seatbelt: always Sunscreen: Regularly PAP: due HIV: No screening, same sexual partner (low risk) Optometry: upcoming - regular Dentistry: Regularly  VACCINES: Tetanus: Due and received today Influenza: received today  Past Medical History:  Diagnosis Date  . Depression 06/25/2019   Past Surgical History:  Procedure Laterality Date  . APPENDECTOMY     Social History   Socioeconomic History  . Marital status: Single    Spouse name: Not on file  . Number of children: Not on file  . Years of education: Not on file  . Highest education level: Not on file  Occupational History  . Not on file  Social Needs  . Financial resource strain: Not on file  . Food insecurity    Worry: Not on file    Inability: Not on file  . Transportation needs    Medical: Not on file    Non-medical: Not on file  Tobacco Use  . Smoking status: Former Smoker    Years: 11.00    Quit date: 01/28/2018    Years since quitting: 1.4  . Smokeless tobacco: Never Used  Substance and Sexual Activity  . Alcohol use: Yes    Alcohol/week: 1.0 standard drinks    Types: 1 Glasses of wine per week  . Drug use: Never  . Sexual activity: Not on file  Lifestyle  . Physical activity    Days per week: Not on file    Minutes per session:  Not on file  . Stress: Not on file  Relationships  . Social Musician on phone: Not on file    Gets together: Not on file    Attends religious service: Not on file    Active member of club or organization: Not on file    Attends meetings of clubs or organizations: Not on file    Relationship status: Not on file  . Intimate partner violence    Fear of current or ex partner: Not on file    Emotionally abused: Not on file    Physically abused: Not on file    Forced sexual activity: Not on file  Other Topics Concern  . Not on file  Social History Narrative  . Not on file   Family History  Problem Relation Age of Onset  . Depression Mother   . Hypertension Mother   . Alcoholism Father   . Mental illness Father   . Hypertension Brother   . Hypertension Maternal Grandfather   . Stroke Maternal Grandfather    Current Outpatient Medications on File Prior to Visit  Medication Sig  . escitalopram (LEXAPRO) 20 MG tablet Take 1 tablet (20 mg total) by mouth daily.  . hydrOXYzine (ATARAX/VISTARIL) 10 MG tablet TAKE 1 TABLET BY MOUTH THREE TIMES A DAY AS NEEDED  . traZODone (DESYREL) 50  MG tablet Take 0.5-1 tablets (25-50 mg total) by mouth at bedtime as needed for sleep.  Marland Kitchen. albuterol (PROVENTIL HFA;VENTOLIN HFA) 108 (90 Base) MCG/ACT inhaler Inhale 1-2 puffs into the lungs every 6 (six) hours as needed for wheezing or shortness of breath. (Patient not taking: Reported on 06/25/2019)   No current facility-administered medications on file prior to visit.     Review of Systems  Constitutional: Negative for chills and fever.  HENT: Negative for congestion and sore throat.   Eyes: Negative for pain.  Respiratory: Negative for cough, shortness of breath and wheezing.   Cardiovascular: Negative for chest pain, palpitations and leg swelling.  Gastrointestinal: Negative for abdominal pain, blood in stool, constipation, diarrhea, nausea and vomiting.  Endocrine: Negative for  polydipsia.  Genitourinary: Negative for dysuria, frequency, hematuria and urgency.  Musculoskeletal: Negative for back pain, myalgias and neck pain.  Skin: Negative.  Negative for rash.  Allergic/Immunologic: Negative for environmental allergies.  Neurological: Negative for dizziness, weakness and headaches.  Hematological: Does not bruise/bleed easily.  Psychiatric/Behavioral: Negative for dysphoric mood and suicidal ideas. The patient is not nervous/anxious.    Per HPI unless specifically indicated above    Objective:    BP (!) 109/51 (BP Location: Left Arm, Patient Position: Sitting, Cuff Size: Normal)   Pulse 63   Ht 5\' 5"  (1.651 m)   Wt 194 lb (88 kg)   BMI 32.28 kg/m   Wt Readings from Last 3 Encounters:  06/25/19 194 lb (88 kg)  10/30/18 190 lb 9.6 oz (86.5 kg)  10/30/18 190 lb 9.6 oz (86.5 kg)    Physical Exam Vitals signs and nursing note reviewed.  Constitutional:      General: She is not in acute distress.    Appearance: She is well-developed.  HENT:     Head: Normocephalic and atraumatic.     Right Ear: External ear normal.     Left Ear: External ear normal.     Nose: Nose normal.  Eyes:     Conjunctiva/sclera: Conjunctivae normal.     Pupils: Pupils are equal, round, and reactive to light.  Neck:     Musculoskeletal: Normal range of motion and neck supple.     Thyroid: No thyromegaly.     Vascular: No JVD.     Trachea: No tracheal deviation.  Cardiovascular:     Rate and Rhythm: Normal rate and regular rhythm.     Heart sounds: Normal heart sounds. No murmur. No friction rub. No gallop.   Pulmonary:     Effort: Pulmonary effort is normal. No respiratory distress.     Breath sounds: Normal breath sounds.  Abdominal:     General: Bowel sounds are normal. There is no distension.     Palpations: Abdomen is soft.     Tenderness: There is no abdominal tenderness.  Genitourinary:    Comments: Normal external female genitalia without lesions or fusion.  Vaginal canal without lesions. Normal appearing cervix without lesions or friability. Physiologic discharge on exam. Bimanual exam without adnexal masses, enlarged uterus. Patient with CMT, generalized pelvic discomfort and sharp right sided pain with manual exam  Musculoskeletal: Normal range of motion.  Lymphadenopathy:     Cervical: No cervical adenopathy.  Skin:    General: Skin is warm and dry.  Neurological:     Mental Status: She is alert and oriented to person, place, and time.     Cranial Nerves: No cranial nerve deficit.  Psychiatric:  Behavior: Behavior normal.        Thought Content: Thought content normal.        Judgment: Judgment normal.    Results for orders placed or performed in visit on 10/30/18  POCT Influenza A/B  Result Value Ref Range   Influenza A, POC Negative Negative   Influenza B, POC Negative Negative      Assessment & Plan:   Problem List Items Addressed This Visit      Other   Depression    Other Visit Diagnoses    Encounter for annual physical exam    -  Primary   Relevant Orders   HIV Antibody (routine testing w rflx) (Completed)   Cytology - PAP   Lipid panel (Completed)   TSH (Completed)   Comprehensive metabolic panel (Completed)   CBC with Differential/Platelet (Completed)   Hemoglobin A1c (Completed)   Need for diphtheria-tetanus-pertussis (Tdap) vaccine       Relevant Orders   Tdap vaccine greater than or equal to 7yo IM (Completed)   Needs flu shot       Relevant Orders   Flu Vaccine QUAD 6+ mos PF IM (Fluarix Quad PF) (Completed)   Cervical cancer screening       Relevant Orders   Cytology - PAP   Screening for HIV without presence of risk factors       Relevant Orders   HIV Antibody (routine testing w rflx) (Completed)   Pelvic pain in female       Relevant Orders   POCT urine pregnancy (Completed)   Screen for STD (sexually transmitted disease)       Relevant Orders   Cervicovaginal ancillary only    Annual  physical exam with new findings of pelvic pain.  Well adult with no acute concerns.  Plan: 1. Obtain health maintenance screenings as above according to age. - Increase physical activity to 30 minutes most days of the week.  - Eat healthy diet high in vegetables and fruits; low in refined carbohydrates. - Screening labs and tests as ordered 2. Return 1 year for annual physical.      Follow up plan: Return in about 1 year (around 06/24/2020) for annual physical AND about 6 weeks for depression.  Cassell Smiles, DNP, AGPCNP-BC Adult Gerontology Primary Care Nurse Practitioner [facility] 06/25/2019, 9:30 AM

## 2019-06-25 NOTE — Patient Instructions (Addendum)
Karen Hanna,   Thank you for coming in to clinic today.  1. Continue practicing your coping skills with exercise, awareness of anxiety/stressors, healthy eating patterns and habits. 2. We can discuss a medication in future for binge eating if needed. 3. Continue trazodone only as needed.  4. Labs today.  We will release results to your Mychart in a couple of days.  Please schedule a follow-up appointment with Wilhelmina McardleLauren Kennedy, AGNP. Return in about 1 year (around 06/24/2020) for annual physical AND about 6 weeks for depression.  If you have any other questions or concerns, please feel free to call the clinic or send a message through MyChart. You may also schedule an earlier appointment if necessary.  You will receive a survey after today's visit either digitally by e-mail or paper by [employer] mail. Your experiences and feedback matter to us.  Please respond so we know how we are doing as we provide care for you.   Wilhelmina McardleLauren Kennedy, DNP, AGNP-BC Adult Gerontology Nurse Practitioner [facility]    Influenza (Flu) Vaccine (Inactivated or Recombinant): What You Need to Know 1. Why get vaccinated? Influenza vaccine can prevent influenza (flu). Flu is a contagious disease that spreads around the [place] every year, usually between October and May. Anyone can get the flu, but it is more dangerous for some people. Infants and young children, people 26 years of age and older, pregnant women, and people with certain health conditions or a weakened immune system are at greatest risk of flu complications. Pneumonia, bronchitis, sinus infections and ear infections are examples of flu-related complications. If you have a medical condition, such as heart disease, cancer or diabetes, flu can make it worse. Flu can cause fever and chills, sore throat, muscle aches, fatigue, cough, headache, and runny or stuffy nose. Some people may have vomiting and diarrhea, though this is  more common in children than adults. Each year thousands of people in the [place] die from flu, and many more are hospitalized. Flu vaccine prevents millions of illnesses and flu-related visits to the doctor each year. 2. Influenza vaccine CDC recommends everyone 896 months of age and older get vaccinated every flu season. Children 6 months through 688 years of age may need 2 doses during a single flu season. Everyone else needs only 1 dose each flu season. It takes about 2 weeks for protection to develop after vaccination. There are many flu viruses, and they are always changing. Each year a new flu vaccine is made to protect against three or four viruses that are likely to cause disease in the upcoming flu season. Even when the vaccine doesn't exactly match these viruses, it may still provide some protection. Influenza vaccine does not cause flu. Influenza vaccine may be given at the same time as other vaccines. 3. Talk with your health care provider Tell your vaccine provider if the person getting the vaccine:  Has had an allergic reaction after a previous dose of influenza vaccine, or has any severe, life-threatening allergies.  Has ever had Guillain-Barr Syndrome (also called GBS). In some cases, your health care provider may decide to postpone influenza vaccination to a future visit. People with minor illnesses, such as a cold, may be vaccinated. People who are moderately or severely ill should usually wait until they recover before getting influenza vaccine. Your health care provider can give you more information. 4. Risks of a vaccine reaction  Soreness, redness, and swelling where shot is given, fever,  muscle aches, and headache can happen after influenza vaccine.  There may be a very small increased risk of Guillain-Barr Syndrome (GBS) after inactivated influenza vaccine (the flu shot). Young children who get the flu shot along with pneumococcal vaccine (PCV13), and/or DTaP  vaccine at the same time might be slightly more likely to have a seizure caused by fever. Tell your health care provider if a child who is getting flu vaccine has ever had a seizure. People sometimes faint after medical procedures, including vaccination. Tell your provider if you feel dizzy or have vision changes or ringing in the ears. As with any medicine, there is a very remote chance of a vaccine causing a severe allergic reaction, other serious injury, or death. 5. What if there is a serious problem? An allergic reaction could occur after the vaccinated person leaves the clinic. If you see signs of a severe allergic reaction (hives, swelling of the face and throat, difficulty breathing, a fast heartbeat, dizziness, or weakness), call 9-1-1 and get the person to the nearest hospital. For other signs that concern you, call your health care provider. Adverse reactions should be reported to the Vaccine Adverse Event Reporting System (VAERS). Your health care provider will usually file this report, or you can do it yourself. Visit the VAERS website at www.vaers.LAgents.no or call 810-676-8053.VAERS is only for reporting reactions, and VAERS staff do not give medical advice. 6. The National Vaccine Injury Compensation Program The [employer] Vaccine Injury Compensation Program (VICP) is a federal program that was created to compensate people who may have been injured by certain vaccines. Visit the VICP website at SpiritualWord.at or call 615-229-5045 to learn about the program and about filing a claim. There is a time limit to file a claim for compensation. 7. How can I learn more?  Ask your healthcare provider.  Call your local or state health department.  Contact the Centers for Disease Control and Prevention (CDC): ? Call 6412106645 (1-800-CDC-INFO) or ? Visit CDC's BiotechRoom.com.cy Vaccine Information Statement (Interim) Inactivated Influenza Vaccine (05/15/2018) This  information is not intended to replace advice given to you by your health care provider. Make sure you discuss any questions you have with your health care provider. Document Released: 07/12/2006 Document Revised: 01/06/2019 Document Reviewed: 05/19/2018 Elsevier Patient Education  2020 [employer].  https://www.cdc.gov/vaccines/hcp/vis/vis-statements/tdap.pdf">  Tdap Vaccine (Tetanus, Diphtheria and Pertussis): What You Need to Know 1. Why get vaccinated? Tetanus, diphtheria and pertussis are very serious diseases. Tdap vaccine can protect [place] from these diseases. And, Tdap vaccine given to pregnant women can protect newborn babies against pertussis.Marland Kitchen TETANUS (Lockjaw) is rare in the [place] today. It causes painful muscle tightening and stiffness, usually all over the body.  It can lead to tightening of muscles in the head and neck so you can't open your mouth, swallow, or sometimes even breathe. Tetanus kills about 1 out of 10 people who are infected even after receiving the best medical care. DIPHTHERIA is also rare in the [place] today. It can cause a thick coating to form in the back of the throat.  It can lead to breathing problems, heart failure, paralysis, and death. PERTUSSIS (Whooping Cough) causes severe coughing spells, which can cause difficulty breathing, vomiting and disturbed sleep.  It can also lead to weight loss, incontinence, and rib fractures. Up to 2 in 100 adolescents and 5 in 100 adults with pertussis are hospitalized or have complications, which could include pneumonia or death. These diseases are caused by bacteria. Diphtheria and  pertussis are spread from person to person through secretions from coughing or sneezing. Tetanus enters the body through cuts, scratches, or wounds. Before vaccines, as many as 200,000 cases of diphtheria, 200,000 cases of pertussis, and hundreds of cases of tetanus, were reported in the [place] each year. Since vaccination  began, reports of cases for tetanus and diphtheria have dropped by about 99% and for pertussis by about 80%. 2. Tdap vaccine Tdap vaccine can protect adolescents and adults from tetanus, diphtheria, and pertussis. One dose of Tdap is routinely given at age 54 or 59. People who did not get Tdap at that age should get it as soon as possible. Tdap is especially important for healthcare professionals and anyone having close contact with a baby younger than 12 months. Pregnant women should get a dose of Tdap during every pregnancy, to protect the newborn from pertussis. Infants are most at risk for severe, life-threatening complications from pertussis. Another vaccine, called Td, protects against tetanus and diphtheria, but not pertussis. A Td booster should be given every 10 years. Tdap may be given as one of these boosters if you have never gotten Tdap before. Tdap may also be given after a severe cut or burn to prevent tetanus infection. Your doctor or the person giving you the vaccine can give you more information. Tdap may safely be given at the same time as other vaccines. 3. Some people should not get this vaccine  A person who has ever had a life-threatening allergic reaction after a previous dose of any diphtheria, tetanus or pertussis containing vaccine, OR has a severe allergy to any part of this vaccine, should not get Tdap vaccine. Tell the person giving the vaccine about any severe allergies.  Anyone who had coma or long repeated seizures within 7 days after a childhood dose of DTP or DTaP, or a previous dose of Tdap, should not get Tdap, unless a cause other than the vaccine was found. They can still get Td.  Talk to your doctor if you: ? have seizures or another nervous system problem, ? had severe pain or swelling after any vaccine containing diphtheria, tetanus or pertussis, ? ever had a condition called Guillain-Barr Syndrome (GBS), ? aren't feeling well on the day the shot is  scheduled. 4. Risks With any medicine, including vaccines, there is a chance of side effects. These are usually mild and go away on their own. Serious reactions are also possible but are rare. Most people who get Tdap vaccine do not have any problems with it. Mild problems following Tdap (Did not interfere with activities)  Pain where the shot was given (about 3 in 4 adolescents or 2 in 3 adults)  Redness or swelling where the shot was given (about 1 person in 5)  Mild fever of at least 100.62F (up to about 1 in 25 adolescents or 1 in 100 adults)  Headache (about 3 or 4 people in 10)  Tiredness (about 1 person in 3 or 4)  Nausea, vomiting, diarrhea, stomach ache (up to 1 in 4 adolescents or 1 in 10 adults)  Chills, sore joints (about 1 person in 10)  Body aches (about 1 person in 3 or 4)  Rash, swollen glands (uncommon) Moderate problems following Tdap (Interfered with activities, but did not require medical attention)  Pain where the shot was given (up to 1 in 5 or 6)  Redness or swelling where the shot was given (up to about 1 in 16 adolescents or 1 in 12  adults)  Fever over 102F (about 1 in 100 adolescents or 1 in 250 adults)  Headache (about 1 in 7 adolescents or 1 in 10 adults)  Nausea, vomiting, diarrhea, stomach ache (up to 1 or 3 people in 100)  Swelling of the entire arm where the shot was given (up to about 1 in 500). Severe problems following Tdap (Unable to perform usual activities; required medical attention)  Swelling, severe pain, bleeding and redness in the arm where the shot was given (rare). Problems that could happen after any vaccine:  People sometimes faint after a medical procedure, including vaccination. Sitting or lying down for about 15 minutes can help prevent fainting, and injuries caused by a fall. Tell your doctor if you feel dizzy, or have vision changes or ringing in the ears.  Some people get severe pain in the shoulder and have  difficulty moving the arm where a shot was given. This happens very rarely.  Any medication can cause a severe allergic reaction. Such reactions from a vaccine are very rare, estimated at fewer than 1 in a million doses, and would happen within a few minutes to a few hours after the vaccination. As with any medicine, there is a very remote chance of a vaccine causing a serious injury or death. The safety of vaccines is always being monitored. For more information, visit: http://floyd.org/ 5. What if there is a serious problem? What should I look for?  Look for anything that concerns you, such as signs of a severe allergic reaction, very high fever, or unusual behavior. Signs of a severe allergic reaction can include hives, swelling of the face and throat, difficulty breathing, a fast heartbeat, dizziness, and weakness. These would usually start a few minutes to a few hours after the vaccination. What should I do?  If you think it is a severe allergic reaction or other emergency that can't wait, call 9-1-1 or get the person to the nearest hospital. Otherwise, call your doctor.  Afterward, the reaction should be reported to the Vaccine Adverse Event Reporting System (VAERS). Your doctor might file this report, or you can do it yourself through the VAERS web site at www.vaers.LAgents.no, or by calling 1-928-512-2516. VAERS does not give medical advice. 6. The National Vaccine Injury Compensation Program The [employer] Vaccine Injury Compensation Program (VICP) is a federal program that was created to compensate people who may have been injured by certain vaccines. Persons who believe they may have been injured by a vaccine can learn about the program and about filing a claim by calling 1-(667)684-8298 or visiting the VICP website at SpiritualWord.at. There is a time limit to file a claim for compensation. 7. How can I learn more?  Ask your doctor. He or she can give you the  vaccine package insert or suggest other sources of information.  Call your local or state health department.  Contact the Centers for Disease Control and Prevention (CDC): ? Call (816)076-3056 (1-800-CDC-INFO) or ? Visit CDC's website at PicCapture.uy Vaccine Information Statement Tdap Vaccine (11/24/2013) This information is not intended to replace advice given to you by your health care provider. Make sure you discuss any questions you have with your health care provider. Document Released: 03/18/2012 Document Revised: 05/05/2018 Document Reviewed: 05/05/2018 Elsevier Interactive Patient Education  [employer].

## 2019-06-26 LAB — CBC WITH DIFFERENTIAL/PLATELET
Absolute Monocytes: 378 cells/uL (ref 200–950)
Basophils Absolute: 83 cells/uL (ref 0–200)
Basophils Relative: 1.4 %
Eosinophils Absolute: 148 cells/uL (ref 15–500)
Eosinophils Relative: 2.5 %
HCT: 43.5 % (ref 35.0–45.0)
Hemoglobin: 14.6 g/dL (ref 11.7–15.5)
Lymphs Abs: 2100 cells/uL (ref 850–3900)
MCH: 29.4 pg (ref 27.0–33.0)
MCHC: 33.6 g/dL (ref 32.0–36.0)
MCV: 87.5 fL (ref 80.0–100.0)
MPV: 10.5 fL (ref 7.5–12.5)
Monocytes Relative: 6.4 %
Neutro Abs: 3192 cells/uL (ref 1500–7800)
Neutrophils Relative %: 54.1 %
Platelets: 215 10*3/uL (ref 140–400)
RBC: 4.97 10*6/uL (ref 3.80–5.10)
RDW: 12.2 % (ref 11.0–15.0)
Total Lymphocyte: 35.6 %
WBC: 5.9 10*3/uL (ref 3.8–10.8)

## 2019-06-26 LAB — LIPID PANEL
Cholesterol: 188 mg/dL (ref <200)
HDL: 47 mg/dL — ABNORMAL LOW (ref >=50)
LDL Cholesterol (Calc): 116 mg/dL (calc) — ABNORMAL HIGH
Non-HDL Cholesterol (Calc): 141 mg/dL (calc) — ABNORMAL HIGH (ref <130)
Total CHOL/HDL Ratio: 4 (calc) (ref <5.0)
Triglycerides: 136 mg/dL (ref <150)

## 2019-06-26 LAB — COMPREHENSIVE METABOLIC PANEL
AG Ratio: 1.9 (calc) (ref 1.0–2.5)
ALT: 16 U/L (ref 6–29)
AST: 28 U/L (ref 10–30)
Albumin: 4.5 g/dL (ref 3.6–5.1)
Alkaline phosphatase (APISO): 56 U/L (ref 31–125)
BUN: 8 mg/dL (ref 7–25)
CO2: 24 mmol/L (ref 20–32)
Calcium: 9.6 mg/dL (ref 8.6–10.2)
Chloride: 104 mmol/L (ref 98–110)
Creat: 0.86 mg/dL (ref 0.50–1.10)
Globulin: 2.4 g/dL (calc) (ref 1.9–3.7)
Glucose, Bld: 84 mg/dL (ref 65–99)
Potassium: 3.9 mmol/L (ref 3.5–5.3)
Sodium: 137 mmol/L (ref 135–146)
Total Bilirubin: 0.5 mg/dL (ref 0.2–1.2)
Total Protein: 6.9 g/dL (ref 6.1–8.1)

## 2019-06-26 LAB — HIV ANTIBODY (ROUTINE TESTING W REFLEX): HIV 1&2 Ab, 4th Generation: NONREACTIVE

## 2019-06-26 LAB — HEMOGLOBIN A1C
Hgb A1c MFr Bld: 4.8 % of total Hgb (ref <5.7)
Mean Plasma Glucose: 91 (calc)
eAG (mmol/L): 5 (calc)

## 2019-06-26 LAB — TSH: TSH: 0.73 mIU/L

## 2019-07-02 LAB — CYTOLOGY - PAP
Adequacy: ABSENT
Diagnosis: NEGATIVE
High risk HPV: NEGATIVE
Molecular Disclaimer: 56
Molecular Disclaimer: NORMAL

## 2019-07-27 ENCOUNTER — Other Ambulatory Visit: Payer: Self-pay | Admitting: Family Medicine

## 2019-07-27 DIAGNOSIS — F419 Anxiety disorder, unspecified: Secondary | ICD-10-CM

## 2019-07-27 DIAGNOSIS — F5104 Psychophysiologic insomnia: Secondary | ICD-10-CM

## 2019-07-27 DIAGNOSIS — F32A Depression, unspecified: Secondary | ICD-10-CM

## 2019-07-27 DIAGNOSIS — F329 Major depressive disorder, single episode, unspecified: Secondary | ICD-10-CM

## 2019-08-06 ENCOUNTER — Other Ambulatory Visit: Payer: Self-pay | Admitting: Nurse Practitioner

## 2019-08-06 DIAGNOSIS — F410 Panic disorder [episodic paroxysmal anxiety] without agoraphobia: Secondary | ICD-10-CM

## 2019-08-06 DIAGNOSIS — F32A Depression, unspecified: Secondary | ICD-10-CM

## 2019-08-06 DIAGNOSIS — F419 Anxiety disorder, unspecified: Secondary | ICD-10-CM

## 2019-08-06 DIAGNOSIS — F329 Major depressive disorder, single episode, unspecified: Secondary | ICD-10-CM

## 2019-09-30 DIAGNOSIS — Z20828 Contact with and (suspected) exposure to other viral communicable diseases: Secondary | ICD-10-CM | POA: Diagnosis not present

## 2019-10-02 NOTE — L&D Delivery Note (Signed)
Delivery Note  0323 Called in room to see patient, effective coached maternal pushing efforts noted.   Spontaneous vaginal birth of liveborn female infant in occiput anterior position at 0348, single loose nuchal reduced on perineum. Infant immediately to maternal abdomen. Delayed cord clamping, skin to skin, and three (3) vessel. APGARS: 8, 9. Weight pending. Receiving nurse and NNP present at bedside for birth due to thick, particulate meconium.   Pitocin bolus infusion. Steady trickle of blood present from vagina. Spontaneous delivery of intact placenta at 0355, to pathology. Vagina packed with laps. Bilateral periurethral, sulcus and second degree perineal lacerations repaired with 3-0 vicryl rapide under epidural and local anesthesia. Lacerations well approximated and hemostatic. Laps removed from vagina. Fundus firm. Rubra small. Counts x 2. Vault check completed. QBL: 900. Repeat CBC ordered.     Initiate routine postpartum care and orders. Mom to postpartum.  Baby to Couplet care / Skin to Skin.  FOB and patient's mother present at bedside and overjoyed with the birth of "Erie Noe".    Serafina Royals  [facility], [facility] 07/05/2020, 4:34 AM

## 2019-10-14 ENCOUNTER — Other Ambulatory Visit: Payer: Self-pay | Admitting: Nurse Practitioner

## 2019-10-14 DIAGNOSIS — F419 Anxiety disorder, unspecified: Secondary | ICD-10-CM

## 2019-10-14 DIAGNOSIS — F329 Major depressive disorder, single episode, unspecified: Secondary | ICD-10-CM

## 2019-10-14 DIAGNOSIS — F410 Panic disorder [episodic paroxysmal anxiety] without agoraphobia: Secondary | ICD-10-CM

## 2019-11-11 ENCOUNTER — Ambulatory Visit (INDEPENDENT_AMBULATORY_CARE_PROVIDER_SITE_OTHER): Payer: BC Managed Care – PPO | Admitting: Obstetrics and Gynecology

## 2019-11-11 ENCOUNTER — Encounter: Payer: Self-pay | Admitting: Obstetrics and Gynecology

## 2019-11-11 ENCOUNTER — Other Ambulatory Visit: Payer: Self-pay

## 2019-11-11 VITALS — BP 109/71 | HR 71 | Ht 65.0 in | Wt 204.9 lb

## 2019-11-11 DIAGNOSIS — N912 Amenorrhea, unspecified: Secondary | ICD-10-CM | POA: Diagnosis not present

## 2019-11-11 DIAGNOSIS — Z3491 Encounter for supervision of normal pregnancy, unspecified, first trimester: Secondary | ICD-10-CM | POA: Diagnosis not present

## 2019-11-11 LAB — POCT URINE PREGNANCY: Preg Test, Ur: POSITIVE — AB

## 2019-11-11 NOTE — Progress Notes (Signed)
HPI:      [name] is a 29 y.o. G1P0 who LMP was Patient's last menstrual period was 09/22/2019.  Subjective:   She presents today for pregnancy confirmation.  She believes she is approximately [redacted] weeks pregnant based on last menstrual period.  She is taking prenatal vitamins.  She was not preventing pregnancy but not specifically attempting.  She has occasional nausea without vomiting and has noted breast tenderness.  She also complains of occasional pelvic cramping without bleeding.    Hx: The following portions of the patient's history were reviewed and updated as appropriate:             She  has a past medical history of Depression (06/25/2019). She does not have any pertinent problems on file. She  has a past surgical history that includes Appendectomy. Her family history includes Alcoholism in her father; Depression in her mother; Hypertension in her brother, maternal grandfather, and mother; Mental illness in her father; Stroke in her maternal grandfather. She  reports that she quit smoking about 21 months ago. She quit after 11.00 years of use. She has never used smokeless tobacco. She reports current alcohol use of about 1.0 standard drinks of alcohol per week. She reports that she does not use drugs. She has a current medication list which includes the following prescription(s): albuterol, escitalopram, hydroxyzine, multivitamin-prenatal, and trazodone. She has No Known Allergies.       Review of Systems:  Review of Systems  Constitutional: Denied constitutional symptoms, night sweats, recent illness, fatigue, fever, insomnia and weight loss.  Eyes: Denied eye symptoms, eye pain, photophobia, vision change and visual disturbance.  Ears/Nose/Throat/Neck: Denied ear, nose, throat or neck symptoms, hearing loss, nasal discharge, sinus congestion and sore throat.  Cardiovascular: Denied cardiovascular symptoms, arrhythmia, chest pain/pressure, edema, exercise  intolerance, orthopnea and palpitations.  Respiratory: Denied pulmonary symptoms, asthma, pleuritic pain, productive sputum, cough, dyspnea and wheezing.  Gastrointestinal: Denied, gastro-esophageal reflux, melena, nausea and vomiting.  Genitourinary: Denied genitourinary symptoms including symptomatic vaginal discharge, pelvic relaxation issues, and urinary complaints.  Musculoskeletal: Denied musculoskeletal symptoms, stiffness, swelling, muscle weakness and myalgia.  Dermatologic: Denied dermatology symptoms, rash and scar.  Neurologic: Denied neurology symptoms, dizziness, headache, neck pain and syncope.  Psychiatric: Denied psychiatric symptoms, anxiety and depression.  Endocrine: Denied endocrine symptoms including hot flashes and night sweats.   Meds:   Current Outpatient Medications on File Prior to Visit  Medication Sig Dispense Refill  . albuterol (PROVENTIL HFA;VENTOLIN HFA) 108 (90 Base) MCG/ACT inhaler Inhale 1-2 puffs into the lungs every 6 (six) hours as needed for wheezing or shortness of breath. 1 Inhaler 2  . escitalopram (LEXAPRO) 20 MG tablet TAKE 1 TABLET BY MOUTH EVERY DAY 90 tablet 1  . hydrOXYzine (ATARAX/VISTARIL) 10 MG tablet TAKE 1 TABLET BY MOUTH THREE TIMES A DAY AS NEEDED 30 tablet 0  . Prenatal Vit-Fe Fumarate-FA (MULTIVITAMIN-PRENATAL) 27-0.8 MG TABS tablet Take 1 tablet by mouth daily at 12 noon.    . traZODone (DESYREL) 50 MG tablet TAKE 0.5-1 TABLETS (25-50 MG TOTAL) BY MOUTH AT BEDTIME AS NEEDED FOR SLEEP. (Patient not taking: Reported on 11/11/2019) 90 tablet 0   No current facility-administered medications on file prior to visit.    Objective:     Vitals:   11/11/19 1539  BP: 109/71  Pulse: 71              Urinary beta-hCG positive  Assessment:    G1P0 Patient Active Problem List  Diagnosis Date Noted  . Depression 06/25/2019     1. Amenorrhea   2. Prenatal care in first trimester        Plan:            Prenatal Plan 1.  The  patient was given prenatal literature. 2.  She was continued on prenatal vitamins. 3.  A prenatal lab panel was ordered or drawn. 4.  An ultrasound was ordered to better determine an EDC. 5.  A nurse visit was scheduled. 6.  Genetic testing and testing for other inheritable conditions discussed in detail. She will decide in the future whether to have these labs performed. 7.  A general overview of pregnancy testing, visit schedule, ultrasound schedule, and prenatal care was discussed. 8.  COVID and its risks associated with pregnancy, prevention by limiting exposure and use of masks, as well as the risks and benefits of vaccination during pregnancy were discussed in detail.  [facility] policy regarding office and hospital visitation and testing was explained. 9.  Benefits of breast-feeding discussed in detail including both maternal and infant benefits. Ready Set Baby website discussed. 10.  We have discussed Lexapro in detail.  Risks and benefits reviewed.  Patient will talk to her prescribing doctor and make a determination whether it is safe to discontinue during the pregnancy.  Risk of birth defects low.  Atarax was also discussed in detail and patient rarely uses this.  Safety in pregnancy discussed.   Orders Orders Placed This Encounter  Procedures  . US OB Comp Less 14 Wks  . US OB Transvaginal  . POCT urine pregnancy    No orders of the defined types were placed in this encounter.     F/U  Return in about 5 weeks (around 12/16/2019). I spent 27 minutes involved in the care of this patient preparing to see the patient by obtaining and reviewing her medical history (including labs, imaging tests and prior procedures), documenting clinical information in the electronic health record (EHR), counseling and coordinating care plans, writing and sending prescriptions, ordering tests or procedures and directly communicating with the patient by discussing pertinent items from her history and physical  exam as well as detailing my assessment and plan as noted above so that she has an informed understanding.  All of her questions were answered.  Elonda Husky, M.D. 11/11/2019 4:05 PM

## 2019-11-18 ENCOUNTER — Encounter: Payer: Self-pay | Admitting: Family Medicine

## 2019-11-18 ENCOUNTER — Other Ambulatory Visit: Payer: Self-pay

## 2019-11-18 ENCOUNTER — Ambulatory Visit (INDEPENDENT_AMBULATORY_CARE_PROVIDER_SITE_OTHER): Payer: BC Managed Care – PPO | Admitting: Family Medicine

## 2019-11-18 DIAGNOSIS — F320 Major depressive disorder, single episode, mild: Secondary | ICD-10-CM | POA: Diagnosis not present

## 2019-11-18 DIAGNOSIS — F410 Panic disorder [episodic paroxysmal anxiety] without agoraphobia: Secondary | ICD-10-CM | POA: Diagnosis not present

## 2019-11-18 DIAGNOSIS — F419 Anxiety disorder, unspecified: Secondary | ICD-10-CM | POA: Diagnosis not present

## 2019-11-18 DIAGNOSIS — F329 Major depressive disorder, single episode, unspecified: Secondary | ICD-10-CM

## 2019-11-18 MED ORDER — HYDROXYZINE HCL 10 MG PO TABS: 10.0000 mg | ORAL_TABLET | Freq: Every evening | ORAL | 1 refills | Status: DC | PRN

## 2019-11-18 MED ORDER — CITALOPRAM HYDROBROMIDE 20 MG PO TABS: 10.0000 mg | ORAL_TABLET | Freq: Every day | ORAL | 0 refills | Status: DC

## 2019-11-18 NOTE — Progress Notes (Signed)
I have reviewed this encounter including the documentation in this note and/or discussed this patient with the provider, Danielle Rankin FNP. I am certifying that I agree with the content of this note as supervising physician.  Saralyn Pilar, DO [facility] 11/18/2019, 1:44 PM

## 2019-11-18 NOTE — Progress Notes (Addendum)
Virtual Visit via Telephone The purpose of this virtual visit is to provide medical care while limiting exposure to the novel coronavirus (COVID19) for both patient and office staff.  Consent was obtained for phone visit:  Yes.   Answered questions that patient had about telehealth interaction:  Yes.   I discussed the limitations, risks, security and privacy concerns of performing an evaluation and management service by telephone. I also discussed with the patient that there may be a patient responsible charge related to this service. The patient expressed understanding and agreed to proceed.  Patient Location: Home Provider Location: [facility])  ---------------------------------------------------------------------- Chief Complaint  Patient presents with  . Anxiety    pt just recently found out that she is [redacted] weeks pregnant. She was recommended to stop anxiety/ depression medication by her OB GYN.  She d/c the Trazodone, Lexapro and Hydroxyzine x 7 days ago.    S: Reviewed CMA documentation. I have called patient and gathered additional HPI as follows:  Patient has recently been to her OB/GYN and has stopped taking escitalopram as of 1 week ago without tapering.  States has been having increased anxiety which makes her depression worse.  Has a support system at home with her spouse and has been talking with him to help her symptoms but feels that they are worsening without medications.  Denies any SI/HI.  Reports that symptoms started years ago.  Patient is currently working  Denies any high risk travel to areas of current concern for COVID19. Denies any known or suspected exposure to person with or possibly with COVID19.  Admits having anxiety, depression and some difficulty sleeping Denies any fevers, chills, sweats, body ache, shortness of breath, sinus pain or pressure, headache, abdominal pain, diarrhea  Past Medical History:  Diagnosis Date  . Depression  06/25/2019   Social History   Tobacco Use  . Smoking status: Former Smoker    Years: 11.00    Quit date: 01/28/2018    Years since quitting: 1.8  . Smokeless tobacco: Never Used  Substance Use Topics  . Alcohol use: Not Currently    Alcohol/week: 1.0 standard drinks    Types: 1 Glasses of wine per week  . Drug use: Never    Current Outpatient Medications:  .  albuterol (PROVENTIL HFA;VENTOLIN HFA) 108 (90 Base) MCG/ACT inhaler, Inhale 1-2 puffs into the lungs every 6 (six) hours as needed for wheezing or shortness of breath., Disp: 1 Inhaler, Rfl: 2 .  hydrOXYzine (ATARAX/VISTARIL) 10 MG tablet, Take 1 tablet (10 mg total) by mouth at bedtime as needed for anxiety., Disp: 30 tablet, Rfl: 1 .  Prenatal Vit-Fe Fumarate-FA (MULTIVITAMIN-PRENATAL) 27-0.8 MG TABS tablet, Take 1 tablet by mouth daily at 12 noon., Disp: , Rfl:  .  citalopram (CELEXA) 20 MG tablet, Take 0.5 tablets (10 mg total) by mouth daily., Disp: 30 tablet, Rfl: 0 .  escitalopram (LEXAPRO) 20 MG tablet, TAKE 1 TABLET BY MOUTH EVERY DAY (Patient not taking: Reported on 11/18/2019), Disp: 90 tablet, Rfl: 1  Depression screen [facility] 2/9 11/18/2019 06/25/2019 01/14/2019  Decreased Interest 1 3 2   Down, Depressed, Hopeless 3 3 3   PHQ - 2 Score 4 6 5   Altered sleeping 1 2 3   Tired, decreased energy 3 2 3   Change in appetite 3 2 3   Feeling bad or failure about yourself  1 1 3   Trouble concentrating 3 1 3   Moving slowly or fidgety/restless 1 1 0  Suicidal thoughts 1 1 3  PHQ-9 Score 17 16 23   Difficult doing work/chores Very difficult Somewhat difficult Somewhat difficult    GAD 7 : Generalized Anxiety Score 11/18/2019 01/14/2019 10/30/2018 08/13/2018  Nervous, Anxious, on Edge 3 1 3 2   Control/stop worrying 3 1 2 2   Worry too much - different things 3 1 3 1   Trouble relaxing 3 1 2 2   Restless 3 3 2 2   Easily annoyed or irritable 3 1 2  0  Afraid - awful might happen 1 1 2 1   Total GAD 7 Score 19 9 16 10   Anxiety Difficulty  Very difficult Very difficult Somewhat difficult Somewhat difficult    -------------------------------------------------------------------------- O: No physical exam performed due to remote telephone encounter.  -------------------------------------------------------------------------- A&P:  Problem List Items Addressed This Visit      Chronic   Anxiety - Primary (Chronic)    Increasing anxiety with abrupt stopping of Lexapro and Vistaril.  Will restart on Hydroxyzine 10mg , 1 tablet, at night before bedtime.  Discussed use of sound machine to help with falling asleep, as well as sleep hygiene, limiting screens/blue light 1 hour before bed and practicing a relaxation technique 30 minutes before bed.      Relevant Medications   hydrOXYzine (ATARAX/VISTARIL) 10 MG tablet   citalopram (CELEXA) 20 MG tablet   Other Relevant Orders   Ambulatory referral to Psychology     Other   Depression    Having increased anxiety/depression with abrupt stopping of Lexapro without tapering dose.  Will start on Celexa 10mg  daily and follow up in 1 week for re-evaluation.       Relevant Medications   hydrOXYzine (ATARAX/VISTARIL) 10 MG tablet   citalopram (CELEXA) 20 MG tablet   Other Relevant Orders   Ambulatory referral to Psychology    Other Visit Diagnoses    Panic       Relevant Medications   hydrOXYzine (ATARAX/VISTARIL) 10 MG tablet   citalopram (CELEXA) 20 MG tablet   Anxiety and depression       Relevant Medications   hydrOXYzine (ATARAX/VISTARIL) 10 MG tablet   citalopram (CELEXA) 20 MG tablet      Meds ordered this encounter  Medications  . hydrOXYzine (ATARAX/VISTARIL) 10 MG tablet    Sig: Take 1 tablet (10 mg total) by mouth at bedtime as needed for anxiety.    Dispense:  30 tablet    Refill:  1    DX Code Needed  . F41.9, F41.0    Order Specific Question:   Supervising Provider    Answer:   Olin Hauser [[id number]]  . citalopram (CELEXA) 20 MG tablet    Sig:  Take 0.5 tablets (10 mg total) by mouth daily.    Dispense:  30 tablet    Refill:  0    Order Specific Question:   Supervising Provider    Answer:   Olin Hauser [[id number]]    Follow-up: - Return in 1 week for medication recheck via virtual visit.  Patient verbalizes understanding with the above medical recommendations including the limitation of remote medical advice.  Specific follow-up and call-back criteria were given for patient to follow-up or seek medical care more urgently if needed.  - Time spent in direct consultation with patient on phone: 14 minutes  Harlin Rain, [facility] 11/18/2019, 9:28 AM

## 2019-11-18 NOTE — Patient Instructions (Addendum)
To continue taking prenatal vitamins.  As we discussed, I have put in a referral for therapy/counseling.  Can look into the virtual options for therapy such as [employer] and/or [employer] and can look into FSA/HSA reimbursement with her insurance to see if this is covered.  If having any thoughts of harming yourself or harming others to call 911 or have someone bring you to the emergency room immediately.  I have sent in a prescription for Celexa to begin taking 10mg  daily and we will connect in 1 week for a re-evaluation.  Prescription sent in for Hydroxyzine 10mg  to take 1 tablet as needed before bedtime.  Practice good sleep hygiene routine with avoiding blue lights/screens 60 minutes before bed, may find some help in using a sound machine, and practicing relaxation techniques 30 minutes before bed.  To call with any questions/concerns/needs.

## 2019-11-18 NOTE — Assessment & Plan Note (Signed)
Having increased anxiety/depression with abrupt stopping of Lexapro without tapering dose.  Will start on Celexa 10mg  daily and follow up in 1 week for re-evaluation.

## 2019-11-18 NOTE — Assessment & Plan Note (Signed)
Increasing anxiety with abrupt stopping of Lexapro and Vistaril.  Will restart on Hydroxyzine 10mg , 1 tablet, at night before bedtime.  Discussed use of sound machine to help with falling asleep, as well as sleep hygiene, limiting screens/blue light 1 hour before bed and practicing a relaxation technique 30 minutes before bed.

## 2019-11-19 ENCOUNTER — Ambulatory Visit: Payer: BC Managed Care – PPO | Admitting: Family Medicine

## 2019-11-19 DIAGNOSIS — R0602 Shortness of breath: Secondary | ICD-10-CM | POA: Diagnosis not present

## 2019-11-19 DIAGNOSIS — R5383 Other fatigue: Secondary | ICD-10-CM | POA: Diagnosis not present

## 2019-11-19 DIAGNOSIS — R6883 Chills (without fever): Secondary | ICD-10-CM | POA: Diagnosis not present

## 2019-11-19 DIAGNOSIS — B349 Viral infection, unspecified: Secondary | ICD-10-CM | POA: Diagnosis not present

## 2019-11-26 ENCOUNTER — Other Ambulatory Visit: Payer: Self-pay

## 2019-11-26 ENCOUNTER — Ambulatory Visit (INDEPENDENT_AMBULATORY_CARE_PROVIDER_SITE_OTHER): Payer: BC Managed Care – PPO

## 2019-11-26 DIAGNOSIS — Z3491 Encounter for supervision of normal pregnancy, unspecified, first trimester: Secondary | ICD-10-CM

## 2019-11-26 DIAGNOSIS — Z0289 Encounter for other administrative examinations: Secondary | ICD-10-CM

## 2019-12-02 ENCOUNTER — Other Ambulatory Visit: Payer: Self-pay | Admitting: Family Medicine

## 2019-12-02 DIAGNOSIS — F32A Depression, unspecified: Secondary | ICD-10-CM

## 2019-12-02 DIAGNOSIS — F329 Major depressive disorder, single episode, unspecified: Secondary | ICD-10-CM

## 2019-12-02 DIAGNOSIS — F410 Panic disorder [episodic paroxysmal anxiety] without agoraphobia: Secondary | ICD-10-CM

## 2019-12-02 DIAGNOSIS — F320 Major depressive disorder, single episode, mild: Secondary | ICD-10-CM

## 2019-12-02 DIAGNOSIS — F419 Anxiety disorder, unspecified: Secondary | ICD-10-CM

## 2019-12-16 NOTE — Progress Notes (Signed)
Griselda Miner Ron presents for NOB nurse interview visit.LMP 09/22/19. Pregnancy confirmation done 11/11/19 DJE.  G 1 P 0. Dating and viability scan done 11/26/19 [redacted]w[redacted]d. EDD 07/04/20.Pregnancy education material explained and given. 0 cats in the home. NOB labs ordered. TSH/HbgA1c due to Increased BMI. HIV labs and Drug screen were explained optional and she declined. Drug screen declined. PNV encouraged. Genetic screening options discussed. Genetic testing: Ordered.  Pt may discuss with provider. Pt. To follow up with provider in _1_ weeks for NOB physical.  All questions answered. FMLA paper signed. Financial paper reviewed.

## 2019-12-17 ENCOUNTER — Ambulatory Visit: Payer: BC Managed Care – PPO

## 2019-12-17 ENCOUNTER — Other Ambulatory Visit: Payer: Self-pay

## 2019-12-17 VITALS — BP 95/55 | HR 66 | Wt 199.5 lb

## 2019-12-17 DIAGNOSIS — Z3481 Encounter for supervision of other normal pregnancy, first trimester: Secondary | ICD-10-CM

## 2019-12-18 LAB — TSH: TSH: 2.17 u[IU]/mL (ref 0.450–4.500)

## 2019-12-18 LAB — CBC WITH DIFFERENTIAL
Basophils Absolute: 0 10*3/uL (ref 0.0–0.2)
Basos: 0 %
EOS (ABSOLUTE): 0.1 10*3/uL (ref 0.0–0.4)
Eos: 1 %
Hematocrit: 38.1 % (ref 34.0–46.6)
Hemoglobin: 13.1 g/dL (ref 11.1–15.9)
Immature Grans (Abs): 0 10*3/uL (ref 0.0–0.1)
Immature Granulocytes: 0 %
Lymphocytes Absolute: 2.5 10*3/uL (ref 0.7–3.1)
Lymphs: 25 %
MCH: 30.4 pg (ref 26.6–33.0)
MCHC: 34.4 g/dL (ref 31.5–35.7)
MCV: 88 fL (ref 79–97)
Monocytes Absolute: 0.4 10*3/uL (ref 0.1–0.9)
Monocytes: 4 %
Neutrophils Absolute: 7 10*3/uL (ref 1.4–7.0)
Neutrophils: 70 %
RBC: 4.31 x10E6/uL (ref 3.77–5.28)
RDW: 12.6 % (ref 11.7–15.4)
WBC: 10.1 10*3/uL (ref 3.4–10.8)

## 2019-12-18 LAB — RPR: RPR Ser Ql: NONREACTIVE

## 2019-12-18 LAB — HEMOGLOBIN A1C
Est. average glucose Bld gHb Est-mCnc: 91 mg/dL
Hgb A1c MFr Bld: 4.8 % (ref 4.8–5.6)

## 2019-12-18 LAB — ANTIBODY SCREEN: Antibody Screen: NEGATIVE

## 2019-12-18 LAB — VARICELLA ZOSTER ANTIBODY, IGG: Varicella zoster IgG: 2569 index (ref >165)

## 2019-12-18 LAB — RUBELLA SCREEN: Rubella Antibodies, IGG: 12 index (ref >0.99)

## 2019-12-18 LAB — ABO AND RH: Rh Factor: POSITIVE

## 2019-12-18 LAB — HEPATITIS B SURFACE ANTIGEN: Hepatitis B Surface Ag: NEGATIVE

## 2019-12-19 LAB — URINE CULTURE

## 2019-12-20 LAB — GC/CHLAMYDIA PROBE AMP
Chlamydia trachomatis, NAA: NEGATIVE
Neisseria Gonorrhoeae by PCR: NEGATIVE

## 2019-12-22 LAB — MATERNIT 21 PLUS CORE, BLOOD
Fetal Fraction: 12
Result (T21): NEGATIVE
Trisomy 13 (Patau syndrome): NEGATIVE
Trisomy 18 (Edwards syndrome): NEGATIVE
Trisomy 21 (Down syndrome): NEGATIVE

## 2019-12-22 LAB — URINALYSIS, ROUTINE W REFLEX MICROSCOPIC

## 2019-12-24 ENCOUNTER — Ambulatory Visit (INDEPENDENT_AMBULATORY_CARE_PROVIDER_SITE_OTHER): Payer: BC Managed Care – PPO | Admitting: Obstetrics and Gynecology

## 2019-12-24 ENCOUNTER — Other Ambulatory Visit: Payer: Self-pay

## 2019-12-24 ENCOUNTER — Encounter: Payer: Self-pay | Admitting: Obstetrics and Gynecology

## 2019-12-24 VITALS — BP 111/70 | HR 69 | Wt 198.0 lb

## 2019-12-24 DIAGNOSIS — Z3481 Encounter for supervision of other normal pregnancy, first trimester: Secondary | ICD-10-CM

## 2019-12-24 DIAGNOSIS — Z3A12 12 weeks gestation of pregnancy: Secondary | ICD-10-CM

## 2019-12-24 NOTE — Progress Notes (Signed)
NOB: Patient denies problems.  Her nausea vomiting is resolving.  She has had some lightheadedness but has been drinking more fluids.  Maternity 21 normal.  aFP next visit  Physical examination General NAD, Conversant  HEENT Atraumatic; Op clear with mmm.  Normo-cephalic. Pupils reactive. Anicteric sclerae  Thyroid/Neck Smooth without nodularity or enlargement. Normal ROM.  Neck Supple.  Skin No rashes, lesions or ulceration. Normal palpated skin turgor. No nodularity.  Breasts: No masses or discharge.  Symmetric.  No axillary adenopathy.  Lungs: Clear to auscultation.No rales or wheezes. Normal Respiratory effort, no retractions.  Heart: NSR.  No murmurs or rubs appreciated. No periferal edema  Abdomen: Soft.  Non-tender.  No masses.  No HSM. No hernia  Extremities: Moves all appropriately.  Normal ROM for age. No lymphadenopathy.  Neuro: Oriented to PPT.  Normal mood. Normal affect.     Pelvic:   Vulva: Normal appearance.  No lesions.  Vagina: No lesions or abnormalities noted.  Support: Normal pelvic support.  Urethra No masses tenderness or scarring.  Meatus Normal size without lesions or prolapse.  Cervix: Normal appearance.  No lesions.  Anus: Normal exam.  No lesions.  Perineum: Normal exam.  No lesions.        Bimanual   Adnexae: No masses.  Non-tender to palpation.  Uterus: Enlarged.  12 weeks positive fetal heart tones non-tender.  Mobile.  AV.  Adnexae: No masses.  Non-tender to palpation.  Cul-de-sac: Negative for abnormality.  Adnexae: No masses.  Non-tender to palpation.         Pelvimetry   Diagonal: Reached.  Spines: Average.  Sacrum: Concave.  Pubic Arch:  Prominent pubic arch

## 2019-12-24 NOTE — Lactation Note (Signed)
Lactation Consultation Note  Patient Name: Karen Hanna [id number]'U Date: 12/24/2019   Lactation student discussed benefits of breastfeeding per the Ready, Set, Baby curriculum. Karen Hanna encouraged to review breastfeeding information on Ready, set, [employer] site and given information for virtual breastfeeding classes.         [name] 12/24/2019, 11:29 AM

## 2020-01-07 ENCOUNTER — Encounter: Payer: Self-pay | Admitting: Family Medicine

## 2020-01-07 ENCOUNTER — Encounter: Payer: Self-pay | Admitting: Emergency Medicine

## 2020-01-07 ENCOUNTER — Ambulatory Visit
Admission: EM | Admit: 2020-01-07 | Discharge: 2020-01-07 | Disposition: A | Discharge status: Home / Self Care | Payer: BC Managed Care – PPO | Attending: Emergency Medicine | Admitting: Emergency Medicine

## 2020-01-07 ENCOUNTER — Other Ambulatory Visit: Payer: Self-pay

## 2020-01-07 DIAGNOSIS — R079 Chest pain, unspecified: Secondary | ICD-10-CM | POA: Diagnosis not present

## 2020-01-07 DIAGNOSIS — R109 Unspecified abdominal pain: Secondary | ICD-10-CM

## 2020-01-07 DIAGNOSIS — Z3A14 14 weeks gestation of pregnancy: Secondary | ICD-10-CM

## 2020-01-07 NOTE — ED Provider Notes (Signed)
Karen Hanna    CSN: [id number] Arrival date & time: 01/07/20  1356      History   Chief Complaint Chief Complaint  Patient presents with  . Chest Pain  . Shoulder Pain    left    HPI Karen Hanna is a 29 y.o. female.   This [redacted] week pregnant patient presents with left upper chest pain since this morning.  The pain is "sharp" "like a pinch", 0-4/10, intermittent, radiating to her left shoulder and arm, with numbness in her left hand.  No chest pain at this time but does have 4/10 pain in her shoulder/arm.  She also reports lower abdominal cramping yesterday and this morning; None currently.  She also reports mild exertional shortness of breath which she says is her baseline.  She also reports belching more than usual today.  No falls or injury. She denies vaginal bleeding, vaginal discharge, pelvic pain, fever, chills, cough, vomiting, diarrhea, or other symptoms.     The history is provided by the patient.    Past Medical History:  Diagnosis Date  . Depression 06/25/2019    Patient Active Problem List   Diagnosis Date Noted  . Anxiety 11/18/2019    Class: Chronic  . Depression 06/25/2019    Past Surgical History:  Procedure Laterality Date  . APPENDECTOMY      OB History    Gravida  2   Para      Term      Preterm      AB  1   Living        SAB      TAB      Ectopic      Multiple      Live Births               Home Medications    Prior to Admission medications   Medication Sig Start Date End Date Taking? Authorizing Provider  Prenatal Vit-Fe Fumarate-FA (MULTIVITAMIN-PRENATAL) 27-0.8 MG TABS tablet Take 1 tablet by mouth daily at 12 noon.   Yes [provider]    Family History Family History  Problem Relation Age of Onset  . Depression Mother   . Hypertension Mother   . Alcoholism Father   . Mental illness Father   . Hypertension Brother   . Hypertension Maternal Grandfather   . Stroke Maternal Grandfather      Social History Social History   Tobacco Use  . Smoking status: Former Smoker    Years: 11.00    Quit date: 01/28/2018    Years since quitting: 1.9  . Smokeless tobacco: Never Used  Substance Use Topics  . Alcohol use: Not Currently    Alcohol/week: 1.0 standard drinks    Types: 1 Glasses of wine per week  . Drug use: Never     Allergies   Patient has no known allergies.   Review of Systems Review of Systems  Constitutional: Negative for chills and fever.  HENT: Negative for ear pain and sore throat.   Eyes: Negative for pain and visual disturbance.  Respiratory: Negative for cough and shortness of breath.   Cardiovascular: Positive for chest pain. Negative for palpitations.  Gastrointestinal: Positive for abdominal pain. Negative for diarrhea, nausea and vomiting.  Genitourinary: Negative for dysuria and hematuria.  Musculoskeletal: Positive for arthralgias. Negative for back pain.  Skin: Negative for color change and rash.  Neurological: Positive for numbness. Negative for dizziness, tremors, seizures, syncope, facial asymmetry, speech difficulty, weakness,  light-headedness and headaches.  All other systems reviewed and are negative.    Physical Exam Triage Vital Signs ED Triage Vitals  Enc Vitals Group     BP 01/07/20 1402 120/75     Pulse Rate 01/07/20 1402 76     Resp 01/07/20 1402 18     Temp 01/07/20 1402 98.8 F (37.1 C)     Temp Source 01/07/20 1402 Oral     SpO2 01/07/20 1402 98 %     Weight 01/07/20 1403 194 lb (88 kg)     Height 01/07/20 1403 5\' 5"  (1.651 m)     Head Circumference --      Peak Flow --      Pain Score 01/07/20 1401 4     Pain Loc --      Pain Edu? --      Excl. in [place]? --    No data found.  Updated Vital Signs BP 120/75 (BP Location: Left Arm)   Pulse 76   Temp 98.8 F (37.1 C) (Oral)   Resp 18   Ht 5\' 5"  (1.651 m)   Wt 194 lb (88 kg)   LMP 09/22/2019   SpO2 98%   BMI 32.28 kg/m   Visual Acuity Right Eye  Distance:   Left Eye Distance:   Bilateral Distance:    Right Eye Near:   Left Eye Near:    Bilateral Near:     Physical Exam Vitals and nursing note reviewed.  Constitutional:      General: She is not in acute distress.    Appearance: She is well-developed. She is not ill-appearing.     Comments: Well-appearing  HENT:     Head: Normocephalic and atraumatic.     Mouth/Throat:     Mouth: Mucous membranes are moist.     Pharynx: Oropharynx is clear.  Eyes:     Conjunctiva/sclera: Conjunctivae normal.  Cardiovascular:     Rate and Rhythm: Normal rate and regular rhythm.     Heart sounds: Normal heart sounds. No murmur.  Pulmonary:     Effort: Pulmonary effort is normal. No respiratory distress.     Breath sounds: Normal breath sounds. No wheezing or rhonchi.  Abdominal:     General: Bowel sounds are normal.     Palpations: Abdomen is soft.     Tenderness: There is no abdominal tenderness.  Musculoskeletal:     Cervical back: Neck supple.     Right lower leg: No edema.     Left lower leg: No edema.  Skin:    General: Skin is warm and dry.     Findings: No rash.  Neurological:     General: No focal deficit present.     Mental Status: She is alert and oriented to person, place, and time.     Sensory: No sensory deficit.     Motor: No weakness.     Coordination: Coordination normal.     Gait: Gait normal.  Psychiatric:        Mood and Affect: Mood normal.        Behavior: Behavior normal.      UC Treatments / Results  Labs (all labs ordered are listed, but only abnormal results are displayed) Labs Reviewed - No data to display  EKG   Radiology No results found.  Procedures Procedures (including critical care time)  Medications Ordered in UC Medications - No data to display  Initial Impression / Assessment and Plan / UC Course  I  have reviewed the triage vital signs and the nursing notes.  Pertinent labs & imaging results that were available during my  care of the patient were reviewed by me and considered in my medical decision making (see chart for details).   Chest pain; Abdominal cramping; [redacted] weeks pregnant.  Patient is well-appearing, NAD, exam unremarkable.  EKG shows sinus rhythm, rate 78, no ST elevation, compared to previous from 2019.  Patient declines transfer to the ED; discussed with her that we cannot fully evaluate her symptoms here; she still declines the ED.  Instructed patient to follow up with her OB/GYN tomorrow and to go to the ED if she has chest pain, shortness of breath, abdominal pain, or other concerning symptoms.  She agrees to plan of care and will go to ED if she feels that she needs to.      Final Clinical Impressions(s) / UC Diagnoses   Final diagnoses:  Chest pain, unspecified type  Abdominal cramping  [redacted] weeks gestation of pregnancy     Discharge Instructions     Go to the emergency department if you have chest pain, shortness of breath, abdominal pain, or other concerning symptoms.    Follow up with your OB/GYN tomorrow; call to schedule an appointment.       ED Prescriptions    None     PDMP not reviewed this encounter.   Mickie Bail, NP 01/07/20 1445

## 2020-01-07 NOTE — ED Triage Notes (Signed)
Pt c/o upper left sided chest pain and left shoulder pain. Started this morning. The pain is sharp. She is having numbness in her left hand also. She says she has shortness of breath but is not new since the chest pain started. She is [redacted] weeks pregnant. She has been burping and belching today more than normal.

## 2020-01-07 NOTE — Telephone Encounter (Signed)
The pt called complaining of intermittent left side shoulder blade pain that radiate down the left side of the chest that started this morning. She denies any injury to the area. States she doesn't notice any discomfort with movement. She describe it as a intermittent sharp pain that happens at random times.  I recommended the patient to seek Urgent/Emergent Care to rule out any cardiac issues. She verbalize understanding, no questions or concerns.

## 2020-01-07 NOTE — Discharge Instructions (Addendum)
Go to the emergency department if you have chest pain, shortness of breath, abdominal pain, or other concerning symptoms.    Follow up with your OB/GYN tomorrow; call to schedule an appointment.

## 2020-01-20 ENCOUNTER — Other Ambulatory Visit: Payer: Self-pay

## 2020-01-20 ENCOUNTER — Encounter: Payer: Self-pay | Admitting: Obstetrics and Gynecology

## 2020-01-20 ENCOUNTER — Ambulatory Visit (INDEPENDENT_AMBULATORY_CARE_PROVIDER_SITE_OTHER): Payer: BC Managed Care – PPO | Admitting: Obstetrics and Gynecology

## 2020-01-20 VITALS — BP 106/68 | HR 69 | Wt 198.5 lb

## 2020-01-20 DIAGNOSIS — Z3A16 16 weeks gestation of pregnancy: Secondary | ICD-10-CM

## 2020-01-20 DIAGNOSIS — O99612 Diseases of the digestive system complicating pregnancy, second trimester: Secondary | ICD-10-CM | POA: Insufficient documentation

## 2020-01-20 DIAGNOSIS — Z1379 Encounter for other screening for genetic and chromosomal anomalies: Secondary | ICD-10-CM | POA: Diagnosis not present

## 2020-01-20 DIAGNOSIS — Z3482 Encounter for supervision of other normal pregnancy, second trimester: Secondary | ICD-10-CM

## 2020-01-20 DIAGNOSIS — K219 Gastro-esophageal reflux disease without esophagitis: Secondary | ICD-10-CM | POA: Insufficient documentation

## 2020-01-20 DIAGNOSIS — Z8659 Personal history of other mental and behavioral disorders: Secondary | ICD-10-CM | POA: Insufficient documentation

## 2020-01-20 LAB — POCT URINALYSIS DIPSTICK OB
Bilirubin, UA: NEGATIVE
Blood, UA: NEGATIVE
Glucose, UA: NEGATIVE
Ketones, UA: NEGATIVE
Leukocytes, UA: NEGATIVE
Nitrite, UA: NEGATIVE
POC,PROTEIN,UA: NEGATIVE
Spec Grav, UA: <=1.005 — AB (ref 1.010–1.025)
Urobilinogen, UA: 0.2 E.U./dL
pH, UA: 8 (ref 5.0–8.0)

## 2020-01-20 NOTE — Patient Instructions (Signed)
Second Trimester of Pregnancy  The second trimester is from week 14 through week 27 (month 4 through 6). This is often the time in pregnancy that you feel your best. Often times, morning sickness has lessened or quit. You may have more energy, and you may get hungry more often. Your unborn baby is growing rapidly. At the end of the sixth month, he or she is about 9 inches long and weighs about 1 pounds. You will likely feel the baby move between 18 and 20 weeks of pregnancy. Follow these instructions at home: Medicines  Take over-the-counter and prescription medicines only as told by your doctor. Some medicines are safe and some medicines are not safe during pregnancy.  Take a prenatal vitamin that contains at least 600 micrograms (mcg) of folic acid.  If you have trouble pooping (constipation), take medicine that will make your stool soft (stool softener) if your doctor approves. Eating and drinking   Eat regular, healthy meals.  Avoid raw meat and uncooked cheese.  If you get low calcium from the food you eat, talk to your doctor about taking a daily calcium supplement.  Avoid foods that are high in fat and sugars, such as fried and sweet foods.  If you feel sick to your stomach (nauseous) or throw up (vomit): ? Eat 4 or 5 small meals a day instead of 3 large meals. ? Try eating a few soda crackers. ? Drink liquids between meals instead of during meals.  To prevent constipation: ? Eat foods that are high in fiber, like fresh fruits and vegetables, whole grains, and beans. ? Drink enough fluids to keep your pee (urine) clear or pale yellow. Activity  Exercise only as told by your doctor. Stop exercising if you start to have cramps.  Do not exercise if it is too hot, too humid, or if you are in a place of great height (high altitude).  Avoid heavy lifting.  Wear low-heeled shoes. Sit and stand up straight.  You can continue to have sex unless your doctor tells you not  to. Relieving pain and discomfort  Wear a good support bra if your breasts are tender.  Take warm water baths (sitz baths) to soothe pain or discomfort caused by hemorrhoids. Use hemorrhoid cream if your doctor approves.  Rest with your legs raised if you have leg cramps or low back pain.  If you develop puffy, bulging veins (varicose veins) in your legs: ? Wear support hose or compression stockings as told by your doctor. ? Raise (elevate) your feet for 15 minutes, 3-4 times a day. ? Limit salt in your food. Prenatal care  Write down your questions. Take them to your prenatal visits.  Keep all your prenatal visits as told by your doctor. This is important. Safety  Wear your seat belt when driving.  Make a list of emergency phone numbers, including numbers for family, friends, the hospital, and police and fire departments. General instructions  Ask your doctor about the right foods to eat or for help finding a counselor, if you need these services.  Ask your doctor about local prenatal classes. Begin classes before month 6 of your pregnancy.  Do not use hot tubs, steam rooms, or saunas.  Do not douche or use tampons or scented sanitary pads.  Do not cross your legs for long periods of time.  Visit your dentist if you have not done so. Use a soft toothbrush to brush your teeth. Floss gently.  Avoid all smoking, herbs,   and alcohol. Avoid drugs that are not approved by your doctor.  Do not use any products that contain nicotine or tobacco, such as cigarettes and e-cigarettes. If you need help quitting, ask your doctor.  Avoid cat litter boxes and soil used by cats. These carry germs that can cause birth defects in the baby and can cause a loss of your baby (miscarriage) or stillbirth. Contact a doctor if:  You have mild cramps or pressure in your lower belly.  You have pain when you pee (urinate).  You have bad smelling fluid coming from your vagina.  You continue to  feel sick to your stomach (nauseous), throw up (vomit), or have watery poop (diarrhea).  You have a nagging pain in your belly area.  You feel dizzy. Get help right away if:  You have a fever.  You are leaking fluid from your vagina.  You have spotting or bleeding from your vagina.  You have severe belly cramping or pain.  You lose or gain weight rapidly.  You have trouble catching your breath and have chest pain.  You notice sudden or extreme puffiness (swelling) of your face, hands, ankles, feet, or legs.  You have not felt the baby move in over an hour.  You have severe headaches that do not go away when you take medicine.  You have trouble seeing. Summary  The second trimester is from week 14 through week 27 (months 4 through 6). This is often the time in pregnancy that you feel your best.  To take care of yourself and your unborn baby, you will need to eat healthy meals, take medicines only if your doctor tells you to do so, and do activities that are safe for you and your baby.  Call your doctor if you get sick or if you notice anything unusual about your pregnancy. Also, call your doctor if you need help with the right food to eat, or if you want to know what activities are safe for you. This information is not intended to replace advice given to you by your health care provider. Make sure you discuss any questions you have with your health care provider. Document Revised: 01/09/2019 Document Reviewed: 10/23/2016 Elsevier Patient Education  [place]. Common Medications Safe in Pregnancy  Acne:      Constipation:  Benzoyl Peroxide     Colace  Clindamycin      Dulcolax Suppository  Topica Erythromycin     Fibercon  Salicylic Acid      Metamucil         Miralax AVOID:        Senakot   Accutane    Cough:  Retin-A       Cough Drops  Tetracycline      Phenergan w/ Codeine if Rx  Minocycline      Robitussin (Plain &  DM)  Antibiotics:     Crabs/Lice:  Ceclor       RID  Cephalosporins    AVOID:  E-Mycins      Kwell  Keflex  Macrobid/Macrodantin   Diarrhea:  Penicillin      Kao-Pectate  Zithromax      Imodium AD         PUSH FLUIDS AVOID:       Cipro     Fever:  Tetracycline      Tylenol (Regular or Extra  Minocycline       Strength)  Levaquin      Extra Strength-Do not  Exceed 8 tabs/24 hrs Caffeine:        <235m/day (equiv. To 1 cup of coffee or  approx. 3 12 oz sodas)         Gas: Cold/Hayfever:       Gas-X  Benadryl      Mylicon  Claritin       Phazyme  **Claritin-D        Chlor-Trimeton    Headaches:  Dimetapp      ASA-Free Excedrin  Drixoral-Non-Drowsy     Cold Compress  Mucinex (Guaifenasin)     Tylenol (Regular or Extra  Sudafed/Sudafed-12 Hour     Strength)  **Sudafed PE Pseudoephedrine   Tylenol Cold & Sinus     Vicks Vapor Rub  Zyrtec  **AVOID if Problems With Blood Pressure         Heartburn: Avoid lying down for at least 1 hour after meals  Aciphex      Maalox     Rash:  Milk of Magnesia     Benadryl    Mylanta       1% Hydrocortisone Cream  Pepcid  Pepcid Complete   Sleep Aids:  Prevacid      Ambien   Prilosec       Benadryl  Rolaids       Chamomile Tea  Tums (Limit 4/day)     Unisom  Zantac       Tylenol PM         Warm milk-add vanilla or  Hemorrhoids:       Sugar for taste  Anusol/Anusol H.C.  (RX: Analapram 2.5%)  Sugar Substitutes:  Hydrocortisone OTC     Ok in moderation  Preparation H      Tucks        Vaseline lotion applied to tissue with wiping    Herpes:     Throat:  Acyclovir      Oragel  Famvir  Valtrex     Vaccines:         Flu Shot Leg Cramps:       *Gardasil  Benadryl      Hepatitis A         Hepatitis B Nasal Spray:       Pneumovax  Saline Nasal Spray     Polio Booster         Tetanus Nausea:       Tuberculosis test or PPD  Vitamin B6 25 mg TID   AVOID:    Dramamine      *Gardasil  Emetrol       Live  Poliovirus  Ginger Root 250 mg QID    MMR (measles, mumps &  High Complex Carbs @ Bedtime    rebella)  Sea Bands-Accupressure    Varicella (Chickenpox)  Unisom 1/2 tab TID     *No known complications           If received before Pain:         Known pregnancy;   Darvocet       Resume series after  Lortab        Delivery  Percocet    Yeast:   Tramadol      Femstat  Tylenol 3      Gyne-lotrimin  Ultram       Monistat  Vicodin           MISC:         All Sunscreens  Hair Coloring/highlights          Insect Repellant's          (Including DEET)         Mystic Tans Breastfeeding  Choosing to breastfeed is one of the best decisions you can make for yourself and your baby. A change in hormones during pregnancy causes your breasts to make breast milk in your milk-producing glands. Hormones prevent breast milk from being released before your baby is born. They also prompt milk flow after birth. Once breastfeeding has begun, thoughts of your baby, as well as his or her sucking or crying, can stimulate the release of milk from your milk-producing glands. Benefits of breastfeeding Research shows that breastfeeding offers many health benefits for infants and mothers. It also offers a cost-free and convenient way to feed your baby. For your baby  Your first milk (colostrum) helps your baby's digestive system to function better.  Special cells in your milk (antibodies) help your baby to fight off infections.  Breastfed babies are less likely to develop asthma, allergies, obesity, or type 2 diabetes. They are also at lower risk for sudden infant death syndrome (SIDS).  Nutrients in breast milk are better able to meet your baby's needs compared to infant formula.  Breast milk improves your baby's brain development. For you  Breastfeeding helps to create a very special bond between you and your baby.  Breastfeeding is convenient. Breast milk costs nothing and is always available at the  correct temperature.  Breastfeeding helps to burn calories. It helps you to lose the weight that you gained during pregnancy.  Breastfeeding makes your uterus return faster to its size before pregnancy. It also slows bleeding (lochia) after you give birth.  Breastfeeding helps to lower your risk of developing type 2 diabetes, osteoporosis, rheumatoid arthritis, cardiovascular disease, and breast, ovarian, uterine, and endometrial cancer later in life. Breastfeeding basics Starting breastfeeding  Find a comfortable place to sit or lie down, with your neck and back well-supported.  Place a pillow or a rolled-up blanket under your baby to bring him or her to the level of your breast (if you are seated). Nursing pillows are specially designed to help support your arms and your baby while you breastfeed.  Make sure that your baby's tummy (abdomen) is facing your abdomen.  Gently massage your breast. With your fingertips, massage from the outer edges of your breast inward toward the nipple. This encourages milk flow. If your milk flows slowly, you may need to continue this action during the feeding.  Support your breast with 4 fingers underneath and your thumb above your nipple (make the letter "C" with your hand). Make sure your fingers are well away from your nipple and your baby's mouth.  Stroke your baby's lips gently with your finger or nipple.  When your baby's mouth is open wide enough, quickly bring your baby to your breast, placing your entire nipple and as much of the areola as possible into your baby's mouth. The areola is the colored area around your nipple. ? More areola should be visible above your baby's upper lip than below the lower lip. ? Your baby's lips should be opened and extended outward (flanged) to ensure an adequate, comfortable latch. ? Your baby's tongue should be between his or her lower gum and your breast.  Make sure that your baby's mouth is correctly positioned  around your nipple (latched). Your baby's lips should create a seal on your breast and be turned   out (everted).  It is common for your baby to suck about 2-3 minutes in order to start the flow of breast milk. Latching Teaching your baby how to latch onto your breast properly is very important. An improper latch can cause nipple pain, decreased milk supply, and poor weight gain in your baby. Also, if your baby is not latched onto your nipple properly, he or she may swallow some air during feeding. This can make your baby fussy. Burping your baby when you switch breasts during the feeding can help to get rid of the air. However, teaching your baby to latch on properly is still the best way to prevent fussiness from swallowing air while breastfeeding. Signs that your baby has successfully latched onto your nipple  Silent tugging or silent sucking, without causing you pain. Infant's lips should be extended outward (flanged).  Swallowing heard between every 3-4 sucks once your milk has started to flow (after your let-down milk reflex occurs).  Muscle movement above and in front of his or her ears while sucking. Signs that your baby has not successfully latched onto your nipple  Sucking sounds or smacking sounds from your baby while breastfeeding.  Nipple pain. If you think your baby has not latched on correctly, slip your finger into the corner of your baby's mouth to break the suction and place it between your baby's gums. Attempt to start breastfeeding again. Signs of successful breastfeeding Signs from your baby  Your baby will gradually decrease the number of sucks or will completely stop sucking.  Your baby will fall asleep.  Your baby's body will relax.  Your baby will retain a small amount of milk in his or her mouth.  Your baby will let go of your breast by himself or herself. Signs from you  Breasts that have increased in firmness, weight, and size 1-3 hours after  feeding.  Breasts that are softer immediately after breastfeeding.  Increased milk volume, as well as a change in milk consistency and color by the fifth day of breastfeeding.  Nipples that are not sore, cracked, or bleeding. Signs that your baby is getting enough milk  Wetting at least 1-2 diapers during the first 24 hours after birth.  Wetting at least 5-6 diapers every 24 hours for the first week after birth. The urine should be clear or pale yellow by the age of 5 days.  Wetting 6-8 diapers every 24 hours as your baby continues to grow and develop.  At least 3 stools in a 24-hour period by the age of 5 days. The stool should be soft and yellow.  At least 3 stools in a 24-hour period by the age of 7 days. The stool should be seedy and yellow.  No loss of weight greater than 10% of birth weight during the first 3 days of life.  Average weight gain of 4-7 oz (113-198 g) per week after the age of 4 days.  Consistent daily weight gain by the age of 5 days, without weight loss after the age of 2 weeks. After a feeding, your baby may spit up a small amount of milk. This is normal. Breastfeeding frequency and duration Frequent feeding will help you make more milk and can prevent sore nipples and extremely full breasts (breast engorgement). Breastfeed when you feel the need to reduce the fullness of your breasts or when your baby shows signs of hunger. This is called "breastfeeding on demand." Signs that your baby is hungry include:  Increased alertness, activity,  or restlessness.  Movement of the head from side to side.  Opening of the mouth when the corner of the mouth or cheek is stroked (rooting).  Increased sucking sounds, smacking lips, cooing, sighing, or squeaking.  Hand-to-mouth movements and sucking on fingers or hands.  Fussing or crying. Avoid introducing a pacifier to your baby in the first 4-6 weeks after your baby is born. After this time, you may choose to use a  pacifier. Research has shown that pacifier use during the first year of a baby's life decreases the risk of sudden infant death syndrome (SIDS). Allow your baby to feed on each breast as long as he or she wants. When your baby unlatches or falls asleep while feeding from the first breast, offer the second breast. Because newborns are often sleepy in the first few weeks of life, you may need to awaken your baby to get him or her to feed. Breastfeeding times will vary from baby to baby. However, the following rules can serve as a guide to help you make sure that your baby is properly fed:  Newborns (babies 40 weeks of age or younger) may breastfeed every 1-3 hours.  Newborns should not go without breastfeeding for longer than 3 hours during the day or 5 hours during the night.  You should breastfeed your baby a minimum of 8 times in a 24-hour period. Breast milk pumping     Pumping and storing breast milk allows you to make sure that your baby is exclusively fed your breast milk, even at times when you are unable to breastfeed. This is especially important if you go back to work while you are still breastfeeding, or if you are not able to be present during feedings. Your lactation consultant can help you find a method of pumping that works best for you and give you guidelines about how long it is safe to store breast milk. Caring for your breasts while you breastfeed Nipples can become dry, cracked, and sore while breastfeeding. The following recommendations can help keep your breasts moisturized and healthy:  Avoid using soap on your nipples.  Wear a supportive bra designed especially for nursing. Avoid wearing underwire-style bras or extremely tight bras (sports bras).  Air-dry your nipples for 3-4 minutes after each feeding.  Use only cotton bra pads to absorb leaked breast milk. Leaking of breast milk between feedings is normal.  Use lanolin on your nipples after breastfeeding. Lanolin  helps to maintain your skin's normal moisture barrier. Pure lanolin is not harmful (not toxic) to your baby. You may also hand express a few drops of breast milk and gently massage that milk into your nipples and allow the milk to air-dry. In the first few weeks after giving birth, some women experience breast engorgement. Engorgement can make your breasts feel heavy, warm, and tender to the touch. Engorgement peaks within 3-5 days after you give birth. The following recommendations can help to ease engorgement:  Completely empty your breasts while breastfeeding or pumping. You may want to start by applying warm, moist heat (in the shower or with warm, water-soaked hand towels) just before feeding or pumping. This increases circulation and helps the milk flow. If your baby does not completely empty your breasts while breastfeeding, pump any extra milk after he or she is finished.  Apply ice packs to your breasts immediately after breastfeeding or pumping, unless this is too uncomfortable for you. To do this: ? Put ice in a plastic bag. ? Place a  towel between your skin and the bag. ? Leave the ice on for 20 minutes, 2-3 times a day.  Make sure that your baby is latched on and positioned properly while breastfeeding. If engorgement persists after 48 hours of following these recommendations, contact your health care provider or a Science writer. Overall health care recommendations while breastfeeding  Eat 3 healthy meals and 3 snacks every day. Well-nourished mothers who are breastfeeding need an additional 450-500 calories a day. You can meet this requirement by increasing the amount of a balanced diet that you eat.  Drink enough water to keep your urine pale yellow or clear.  Rest often, relax, and continue to take your prenatal vitamins to prevent fatigue, stress, and low vitamin and mineral levels in your body (nutrient deficiencies).  Do not use any products that contain nicotine or  tobacco, such as cigarettes and e-cigarettes. Your baby may be harmed by chemicals from cigarettes that pass into breast milk and exposure to secondhand smoke. If you need help quitting, ask your health care provider.  Avoid alcohol.  Do not use illegal drugs or marijuana.  Talk with your health care provider before taking any medicines. These include over-the-counter and prescription medicines as well as vitamins and herbal supplements. Some medicines that may be harmful to your baby can pass through breast milk.  It is possible to become pregnant while breastfeeding. If birth control is desired, ask your health care provider about options that will be safe while breastfeeding your baby. Where to find more information: [employer]: www.llli.org Contact a health care provider if:  You feel like you want to stop breastfeeding or have become frustrated with breastfeeding.  Your nipples are cracked or bleeding.  Your breasts are red, tender, or warm.  You have: ? Painful breasts or nipples. ? A swollen area on either breast. ? A fever or chills. ? Nausea or vomiting. ? Drainage other than breast milk from your nipples.  Your breasts do not become full before feedings by the fifth day after you give birth.  You feel sad and depressed.  Your baby is: ? Too sleepy to eat well. ? Having trouble sleeping. ? More than 19 week old and wetting fewer than 6 diapers in a 24-hour period. ? Not gaining weight by 61 days of age.  Your baby has fewer than 3 stools in a 24-hour period.  Your baby's skin or the white parts of his or her eyes become yellow. Get help right away if:  Your baby is overly tired (lethargic) and does not want to wake up and feed.  Your baby develops an unexplained fever. Summary  Breastfeeding offers many health benefits for infant and mothers.  Try to breastfeed your infant when he or she shows early signs of hunger.  Gently tickle or stroke  your baby's lips with your finger or nipple to allow the baby to open his or her mouth. Bring the baby to your breast. Make sure that much of the areola is in your baby's mouth. Offer one side and burp the baby before you offer the other side.  Talk with your health care provider or lactation consultant if you have questions or you face problems as you breastfeed. This information is not intended to replace advice given to you by your health care provider. Make sure you discuss any questions you have with your health care provider. Document Revised: 12/12/2017 Document Reviewed: 10/19/2016 Elsevier Patient Education  [place].

## 2020-01-20 NOTE — Progress Notes (Signed)
ROB: Complains of mild cramping. Also notes gastric reflux, partially relieved by Tums.  Lastly, patient complains of increase in vaginal discharge, but denies symptoms. Given reassurance, advised on OTC reflux meds and decreasing intake of spicy foods. H/o depression, notes she stopped her medications when she found out she was pregnant (Citalopram and Trazodone). Discussed depression in pregnancy, currently asymptomatic. Baseline PHQ-9 done today, score is 10. Patient advised on counseling. RTC in 4 weeks, for anatomy scan. AFP done today.

## 2020-01-20 NOTE — Progress Notes (Signed)
ROB-Pt present for routine prenatal care. Pt stated that she has noticed an increase in her discharge and mild cramping off and on along with acid reflux at night. Pt stated that she was doing well.

## 2020-01-23 LAB — AFP, SERUM, OPEN SPINA BIFIDA
AFP MoM: 1.29
AFP Value: 38.3 ng/mL
Gest. Age on Collection Date: 16.2 weeks
Maternal Age At EDD: 28.1 yr
OSBR Risk 1 IN: 4947
Test Results:: NEGATIVE
Weight: 198 [lb_av]

## 2020-02-16 ENCOUNTER — Telehealth: Payer: Self-pay | Admitting: Obstetrics and Gynecology

## 2020-02-16 NOTE — Telephone Encounter (Signed)
Called patient to inform her of U/S situation. LVM for her to return our phone call, also left a MyChart message for patient.

## 2020-02-17 DIAGNOSIS — Z20822 Contact with and (suspected) exposure to covid-19: Secondary | ICD-10-CM | POA: Diagnosis not present

## 2020-02-18 ENCOUNTER — Encounter: Payer: BC Managed Care – PPO | Admitting: Obstetrics and Gynecology

## 2020-02-18 ENCOUNTER — Other Ambulatory Visit: Payer: BC Managed Care – PPO

## 2020-02-23 ENCOUNTER — Other Ambulatory Visit: Payer: BC Managed Care – PPO

## 2020-02-23 DIAGNOSIS — Z20822 Contact with and (suspected) exposure to covid-19: Secondary | ICD-10-CM | POA: Diagnosis not present

## 2020-03-02 ENCOUNTER — Ambulatory Visit (INDEPENDENT_AMBULATORY_CARE_PROVIDER_SITE_OTHER): Payer: BC Managed Care – PPO | Admitting: Obstetrics and Gynecology

## 2020-03-02 ENCOUNTER — Other Ambulatory Visit: Payer: Self-pay

## 2020-03-02 ENCOUNTER — Ambulatory Visit (INDEPENDENT_AMBULATORY_CARE_PROVIDER_SITE_OTHER): Payer: BC Managed Care – PPO

## 2020-03-02 ENCOUNTER — Encounter: Payer: Self-pay | Admitting: Obstetrics and Gynecology

## 2020-03-02 ENCOUNTER — Encounter: Payer: BC Managed Care – PPO | Admitting: Obstetrics and Gynecology

## 2020-03-02 VITALS — BP 113/81 | HR 90 | Wt 211.6 lb

## 2020-03-02 DIAGNOSIS — Z3482 Encounter for supervision of other normal pregnancy, second trimester: Secondary | ICD-10-CM | POA: Diagnosis not present

## 2020-03-02 DIAGNOSIS — Z3A22 22 weeks gestation of pregnancy: Secondary | ICD-10-CM

## 2020-03-02 LAB — POCT URINALYSIS DIPSTICK OB
Bilirubin, UA: NEGATIVE
Blood, UA: NEGATIVE
Glucose, UA: NEGATIVE
Ketones, UA: NEGATIVE
Leukocytes, UA: NEGATIVE
Nitrite, UA: NEGATIVE
POC,PROTEIN,UA: NEGATIVE
Spec Grav, UA: 1.015 (ref 1.010–1.025)
Urobilinogen, UA: 0.2 E.U./dL
pH, UA: 8 (ref 5.0–8.0)

## 2020-03-02 NOTE — Progress Notes (Signed)
ROB: She has no complaints today.  Had her anatomy ultrasound today.  Feels daily fetal movement.  1 hour GCT next visit.

## 2020-03-02 NOTE — Progress Notes (Signed)
Patient comes in today for ROB visit. She has no concerns today.  

## 2020-03-12 DIAGNOSIS — H10413 Chronic giant papillary conjunctivitis, bilateral: Secondary | ICD-10-CM | POA: Diagnosis not present

## 2020-03-28 ENCOUNTER — Observation Stay
Admission: EM | Admit: 2020-03-28 | Discharge: 2020-03-28 | Disposition: A | Discharge status: Home / Self Care | Payer: BC Managed Care – PPO | Attending: Obstetrics and Gynecology | Admitting: Obstetrics and Gynecology

## 2020-03-28 DIAGNOSIS — Z3A26 26 weeks gestation of pregnancy: Secondary | ICD-10-CM | POA: Insufficient documentation

## 2020-03-28 DIAGNOSIS — O26892 Other specified pregnancy related conditions, second trimester: Principal | ICD-10-CM | POA: Insufficient documentation

## 2020-03-28 DIAGNOSIS — O26899 Other specified pregnancy related conditions, unspecified trimester: Secondary | ICD-10-CM | POA: Diagnosis present

## 2020-03-28 DIAGNOSIS — R109 Unspecified abdominal pain: Secondary | ICD-10-CM | POA: Diagnosis not present

## 2020-03-28 DIAGNOSIS — R102 Pelvic and perineal pain: Secondary | ICD-10-CM | POA: Diagnosis not present

## 2020-03-28 LAB — URINALYSIS, COMPLETE (UACMP) WITH MICROSCOPIC
Bilirubin Urine: NEGATIVE
Glucose, UA: NEGATIVE mg/dL
Hgb urine dipstick: NEGATIVE
Ketones, ur: NEGATIVE mg/dL
Nitrite: NEGATIVE
Protein, ur: NEGATIVE mg/dL
Specific Gravity, Urine: 1.016 (ref 1.005–1.030)
pH: 7 (ref 5.0–8.0)

## 2020-03-28 LAB — WET PREP, GENITAL
Clue Cells Wet Prep HPF POC: NONE SEEN
Sperm: NONE SEEN
Trich, Wet Prep: NONE SEEN
Yeast Wet Prep HPF POC: NONE SEEN

## 2020-03-28 LAB — RUPTURE OF MEMBRANE (ROM)PLUS: Rom Plus: NEGATIVE

## 2020-03-28 MED ORDER — ACETAMINOPHEN 325 MG PO TABS: 650.0000 mg | ORAL_TABLET | ORAL | Status: DC | PRN

## 2020-03-28 NOTE — Final Progress Note (Signed)
L&D OB Triage Note  JAYLYNN MCALEER is a 29 y.o. [id number] female at [redacted]w[redacted]d, EDD Estimated Date of Delivery: 07/04/20 who presented to triage for complaints of possible LOF and pelvic cramping that began today while at work after pushing a heavy door.  She was evaluated by the nurses with no significant findings. Vital signs stable. An NST was performed and has been reviewed by MD. She declined treatment with PO Tylenol.   NST INTERPRETATION: Indications: rule out uterine contractions  Mode: External Baseline Rate (A): 145 bpm (fht) Variability: Moderate Accelerations: 10 x 10 Decelerations: None     Contraction Frequency (min): None noted  Impression: reactive   Labs:  Results for orders placed or performed during the hospital encounter of 03/28/20  Wet prep, genital  Result Value Ref Range   Yeast Wet Prep HPF POC NONE SEEN NONE SEEN   Trich, Wet Prep NONE SEEN NONE SEEN   Clue Cells Wet Prep HPF POC NONE SEEN NONE SEEN   WBC, Wet Prep HPF POC RARE (A) NONE SEEN   Sperm NONE SEEN   Urinalysis, Complete w Microscopic  Result Value Ref Range   Color, Urine YELLOW (A) YELLOW   APPearance HAZY (A) CLEAR   Specific Gravity, Urine 1.016 1.005 - 1.030   pH 7.0 5.0 - 8.0   Glucose, UA NEGATIVE NEGATIVE mg/dL   Hgb urine dipstick NEGATIVE NEGATIVE   Bilirubin Urine NEGATIVE NEGATIVE   Ketones, ur NEGATIVE NEGATIVE mg/dL   Protein, ur NEGATIVE NEGATIVE mg/dL   Nitrite NEGATIVE NEGATIVE   Leukocytes,Ua TRACE (A) NEGATIVE   RBC / HPF 0-5 0 - 5 RBC/hpf   WBC, UA 0-5 0 - 5 WBC/hpf   Bacteria, UA RARE (A) NONE SEEN   Squamous Epithelial / LPF 0-5 0 - 5   Mucus PRESENT   ROM Plus ([facility] only)  Result Value Ref Range   Rom Plus NEGATIVE       Plan: NST performed was reviewed and was found to be reactive. She was discharged home and advised on light activity.  Continue routine prenatal care. Follow up with OB/GYN as previously scheduled.     Hildred Laser, MD  [facility]

## 2020-03-28 NOTE — OB Triage Note (Signed)
Presents with complaints of lower abdominal cramping and some leakage of fluid that started about and hour ago while she was at work. States that she felt fluid running down her leg and that her panties were wet. Patient has since changed underwear and states that her panties are damp. Denies bleeding.

## 2020-03-28 NOTE — Progress Notes (Signed)
Discharge instructions reviewed and pain management discussed. Pt has PNV on Wed with Dr. Valentino Saxon. Reviewed red flag symptoms and pt verbalized understanding. Pt stable at time of discharge with significant other. No further needs at this time.

## 2020-03-29 NOTE — Progress Notes (Signed)
ROB-Pt present for routine prenatal care, 28 week labs, tdap and BTC. Pt stated having upper abd and pelvic pain.

## 2020-03-29 NOTE — Patient Instructions (Addendum)
https://www.cdc.gov/vaccines/hcp/vis/vis-statements/tdap.pdf">  Tdap (Tetanus, Diphtheria, Pertussis) Vaccine: What You Need to Know 1. Why get vaccinated? Tdap vaccine can prevent tetanus, diphtheria, and pertussis. Diphtheria and pertussis spread from person to person. Tetanus enters the body through cuts or wounds.  TETANUS (T) causes painful stiffening of the muscles. Tetanus can lead to serious health problems, including being unable to open the mouth, having trouble swallowing and breathing, or death.  DIPHTHERIA (D) can lead to difficulty breathing, heart failure, paralysis, or death.  PERTUSSIS (aP), also known as "whooping cough," can cause uncontrollable, violent coughing which makes it hard to breathe, eat, or drink. Pertussis can be extremely serious in babies and young children, causing pneumonia, convulsions, brain damage, or death. In teens and adults, it can cause weight loss, loss of bladder control, passing out, and rib fractures from severe coughing. 2. Tdap vaccine Tdap is only for children 7 years and older, adolescents, and adults.  Adolescents should receive a single dose of Tdap, preferably at age 53 or 35 years. Pregnant women should get a dose of Tdap during every pregnancy, to protect the newborn from pertussis. Infants are most at risk for severe, life-threatening complications from pertussis. Adults who have never received Tdap should get a dose of Tdap. Also, adults should receive a booster dose every 10 years, or earlier in the case of a severe and dirty wound or burn. Booster doses can be either Tdap or Td (a different vaccine that protects against tetanus and diphtheria but not pertussis). Tdap may be given at the same time as other vaccines. 3. Talk with your health care provider Tell your vaccine provider if the person getting the vaccine:  Has had an allergic reaction after a previous dose of any vaccine that protects against tetanus, diphtheria, or pertussis,  or has any severe, life-threatening allergies.  Has had a coma, decreased level of consciousness, or prolonged seizures within 7 days after a previous dose of any pertussis vaccine (DTP, DTaP, or Tdap).  Has seizures or another nervous system problem.  Has ever had Guillain-Barr Syndrome (also called GBS).  Has had severe pain or swelling after a previous dose of any vaccine that protects against tetanus or diphtheria. In some cases, your health care provider may decide to postpone Tdap vaccination to a future visit.  People with minor illnesses, such as a cold, may be vaccinated. People who are moderately or severely ill should usually wait until they recover before getting Tdap vaccine.  Your health care provider can give you more information. 4. Risks of a vaccine reaction  Pain, redness, or swelling where the shot was given, mild fever, headache, feeling tired, and nausea, vomiting, diarrhea, or stomachache sometimes happen after Tdap vaccine. People sometimes faint after medical procedures, including vaccination. Tell your provider if you feel dizzy or have vision changes or ringing in the ears.  As with any medicine, there is a very remote chance of a vaccine causing a severe allergic reaction, other serious injury, or death. 5. What if there is a serious problem? An allergic reaction could occur after the vaccinated person leaves the clinic. If you see signs of a severe allergic reaction (hives, swelling of the face and throat, difficulty breathing, a fast heartbeat, dizziness, or weakness), call 9-1-1 and get the person to the nearest hospital. For other signs that concern you, call your health care provider.  Adverse reactions should be reported to the Vaccine Adverse Event Reporting System (VAERS). Your health care provider will usually file this report,  or you can do it yourself. Visit the VAERS website at www.vaers.LAgents.no or call 580-144-0774. VAERS is only for reporting  reactions, and VAERS staff do not give medical advice. 6. The National Vaccine Injury Compensation Program The [employer] Vaccine Injury Compensation Program (VICP) is a federal program that was created to compensate people who may have been injured by certain vaccines. Visit the VICP website at SpiritualWord.at or call 828-488-3260 to learn about the program and about filing a claim. There is a time limit to file a claim for compensation. 7. How can I learn more?  Ask your health care provider.  Call your local or state health department.  Contact the Centers for Disease Control and Prevention (CDC): ? Call 5200069143 (1-800-CDC-INFO) or ? Visit CDC's website at PicCapture.uy Vaccine Information Statement Tdap (Tetanus, Diphtheria, Pertussis) Vaccine (12/31/2018) This information is not intended to replace advice given to you by your health care provider. Make sure you discuss any questions you have with your health care provider. Document Revised: 01/09/2019 Document Reviewed: 01/12/2019 Elsevier Patient Education  2020 [employer].  Third Trimester of Pregnancy  The third trimester is from week 28 through week 40 (months 7 through 9). This trimester is when your unborn baby (fetus) is growing very fast. At the end of the ninth month, the unborn baby is about 20 inches in length. It weighs about 6-10 pounds. Follow these instructions at home: Medicines  Take over-the-counter and prescription medicines only as told by your doctor. Some medicines are safe and some medicines are not safe during pregnancy.  Take a prenatal vitamin that contains at least 600 micrograms (mcg) of folic acid.  If you have trouble pooping (constipation), take medicine that will make your stool soft (stool softener) if your doctor approves. Eating and drinking   Eat regular, healthy meals.  Avoid raw meat and uncooked cheese.  If you get low calcium from the food you eat, talk  to your doctor about taking a daily calcium supplement.  Eat four or five small meals rather than three large meals a day.  Avoid foods that are high in fat and sugars, such as fried and sweet foods.  To prevent constipation: ? Eat foods that are high in fiber, like fresh fruits and vegetables, whole grains, and beans. ? Drink enough fluids to keep your pee (urine) clear or pale yellow. Activity  Exercise only as told by your doctor. Stop exercising if you start to have cramps.  Avoid heavy lifting, wear low heels, and sit up straight.  Do not exercise if it is too hot, too humid, or if you are in a place of great height (high altitude).  You may continue to have sex unless your doctor tells you not to. Relieving pain and discomfort  Wear a good support bra if your breasts are tender.  Take frequent breaks and rest with your legs raised if you have leg cramps or low back pain.  Take warm water baths (sitz baths) to soothe pain or discomfort caused by hemorrhoids. Use hemorrhoid cream if your doctor approves.  If you develop puffy, bulging veins (varicose veins) in your legs: ? Wear support hose or compression stockings as told by your doctor. ? Raise (elevate) your feet for 15 minutes, 3-4 times a day. ? Limit salt in your food. Safety  Wear your seat belt when driving.  Make a list of emergency phone numbers, including numbers for family, friends, the hospital, and police and fire departments. Preparing for your baby's  arrival To prepare for the arrival of your baby:  Take prenatal classes.  Practice driving to the hospital.  Visit the hospital and tour the maternity area.  Talk to your work about taking leave once the baby comes.  Pack your hospital bag.  Prepare the baby's room.  Go to your doctor visits.  Buy a rear-facing car seat. Learn how to install it in your car. General instructions  Do not use hot tubs, steam rooms, or saunas.  Do not use any  products that contain nicotine or tobacco, such as cigarettes and e-cigarettes. If you need help quitting, ask your doctor.  Do not drink alcohol.  Do not douche or use tampons or scented sanitary pads.  Do not cross your legs for long periods of time.  Do not travel for long distances unless you must. Only do so if your doctor says it is okay.  Visit your dentist if you have not gone during your pregnancy. Use a soft toothbrush to brush your teeth. Be gentle when you floss.  Avoid cat litter boxes and soil used by cats. These carry germs that can cause birth defects in the baby and can cause a loss of your baby (miscarriage) or stillbirth.  Keep all your prenatal visits as told by your doctor. This is important. Contact a doctor if:  You are not sure if you are in labor or if your water has broken.  You are dizzy.  You have mild cramps or pressure in your lower belly.  You have a nagging pain in your belly area.  You continue to feel sick to your stomach, you throw up, or you have watery poop.  You have bad smelling fluid coming from your vagina.  You have pain when you pee. Get help right away if:  You have a fever.  You are leaking fluid from your vagina.  You are spotting or bleeding from your vagina.  You have severe belly cramps or pain.  You lose or gain weight quickly.  You have trouble catching your breath and have chest pain.  You notice sudden or extreme puffiness (swelling) of your face, hands, ankles, feet, or legs.  You have not felt the baby move in over an hour.  You have severe headaches that do not go away with medicine.  You have trouble seeing.  You are leaking, or you are having a gush of fluid, from your vagina before you are 37 weeks.  You have regular belly spasms (contractions) before you are 37 weeks. Summary  The third trimester is from week 28 through week 40 (months 7 through 9). This time is when your unborn baby is growing very  fast.  Follow your doctor's advice about medicine, food, and activity.  Get ready for the arrival of your baby by taking prenatal classes, getting all the baby items ready, preparing the baby's room, and visiting your doctor to be checked.  Get help right away if you are bleeding from your vagina, or you have chest pain and trouble catching your breath, or if you have not felt your baby move in over an hour. This information is not intended to replace advice given to you by your health care provider. Make sure you discuss any questions you have with your health care provider. Document Revised: 01/08/2019 Document Reviewed: 10/23/2016 Elsevier Patient Education  2020 [employer].  Breastfeeding  Choosing to breastfeed is one of the best decisions you can make for yourself and your baby. A  change in hormones during pregnancy causes your breasts to make breast milk in your milk-producing glands. Hormones prevent breast milk from being released before your baby is born. They also prompt milk flow after birth. Once breastfeeding has begun, thoughts of your baby, as well as his or her sucking or crying, can stimulate the release of milk from your milk-producing glands. Benefits of breastfeeding Research shows that breastfeeding offers many health benefits for infants and mothers. It also offers a cost-free and convenient way to feed your baby. For your baby  Your first milk (colostrum) helps your baby's digestive system to function better.  Special cells in your milk (antibodies) help your baby to fight off infections.  Breastfed babies are less likely to develop asthma, allergies, obesity, or type 2 diabetes. They are also at lower risk for sudden infant death syndrome (SIDS).  Nutrients in breast milk are better able to meet your baby's needs compared to infant formula.  Breast milk improves your baby's brain development. For you  Breastfeeding helps to create a very special bond between  you and your baby.  Breastfeeding is convenient. Breast milk costs nothing and is always available at the correct temperature.  Breastfeeding helps to burn calories. It helps you to lose the weight that you gained during pregnancy.  Breastfeeding makes your uterus return faster to its size before pregnancy. It also slows bleeding (lochia) after you give birth.  Breastfeeding helps to lower your risk of developing type 2 diabetes, osteoporosis, rheumatoid arthritis, cardiovascular disease, and breast, ovarian, uterine, and endometrial cancer later in life. Breastfeeding basics Starting breastfeeding  Find a comfortable place to sit or lie down, with your neck and back well-supported.  Place a pillow or a rolled-up blanket under your baby to bring him or her to the level of your breast (if you are seated). Nursing pillows are specially designed to help support your arms and your baby while you breastfeed.  Make sure that your baby's tummy (abdomen) is facing your abdomen.  Gently massage your breast. With your fingertips, massage from the outer edges of your breast inward toward the nipple. This encourages milk flow. If your milk flows slowly, you may need to continue this action during the feeding.  Support your breast with 4 fingers underneath and your thumb above your nipple (make the letter "C" with your hand). Make sure your fingers are well away from your nipple and your baby's mouth.  Stroke your baby's lips gently with your finger or nipple.  When your baby's mouth is open wide enough, quickly bring your baby to your breast, placing your entire nipple and as much of the areola as possible into your baby's mouth. The areola is the colored area around your nipple. ? More areola should be visible above your baby's upper lip than below the lower lip. ? Your baby's lips should be opened and extended outward (flanged) to ensure an adequate, comfortable latch. ? Your baby's tongue should be  between his or her lower gum and your breast.  Make sure that your baby's mouth is correctly positioned around your nipple (latched). Your baby's lips should create a seal on your breast and be turned out (everted).  It is common for your baby to suck about 2-3 minutes in order to start the flow of breast milk. Latching Teaching your baby how to latch onto your breast properly is very important. An improper latch can cause nipple pain, decreased milk supply, and poor weight gain in your baby.  Also, if your baby is not latched onto your nipple properly, he or she may swallow some air during feeding. This can make your baby fussy. Burping your baby when you switch breasts during the feeding can help to get rid of the air. However, teaching your baby to latch on properly is still the best way to prevent fussiness from swallowing air while breastfeeding. Signs that your baby has successfully latched onto your nipple  Silent tugging or silent sucking, without causing you pain. Infant's lips should be extended outward (flanged).  Swallowing heard between every 3-4 sucks once your milk has started to flow (after your let-down milk reflex occurs).  Muscle movement above and in front of his or her ears while sucking. Signs that your baby has not successfully latched onto your nipple  Sucking sounds or smacking sounds from your baby while breastfeeding.  Nipple pain. If you think your baby has not latched on correctly, slip your finger into the corner of your baby's mouth to break the suction and place it between your baby's gums. Attempt to start breastfeeding again. Signs of successful breastfeeding Signs from your baby  Your baby will gradually decrease the number of sucks or will completely stop sucking.  Your baby will fall asleep.  Your baby's body will relax.  Your baby will retain a small amount of milk in his or her mouth.  Your baby will let go of your breast by himself or  herself. Signs from you  Breasts that have increased in firmness, weight, and size 1-3 hours after feeding.  Breasts that are softer immediately after breastfeeding.  Increased milk volume, as well as a change in milk consistency and color by the fifth day of breastfeeding.  Nipples that are not sore, cracked, or bleeding. Signs that your baby is getting enough milk  Wetting at least 1-2 diapers during the first 24 hours after birth.  Wetting at least 5-6 diapers every 24 hours for the first week after birth. The urine should be clear or pale yellow by the age of 5 days.  Wetting 6-8 diapers every 24 hours as your baby continues to grow and develop.  At least 3 stools in a 24-hour period by the age of 5 days. The stool should be soft and yellow.  At least 3 stools in a 24-hour period by the age of 7 days. The stool should be seedy and yellow.  No loss of weight greater than 10% of birth weight during the first 3 days of life.  Average weight gain of 4-7 oz (113-198 g) per week after the age of 4 days.  Consistent daily weight gain by the age of 5 days, without weight loss after the age of 2 weeks. After a feeding, your baby may spit up a small amount of milk. This is normal. Breastfeeding frequency and duration Frequent feeding will help you make more milk and can prevent sore nipples and extremely full breasts (breast engorgement). Breastfeed when you feel the need to reduce the fullness of your breasts or when your baby shows signs of hunger. This is called "breastfeeding on demand." Signs that your baby is hungry include:  Increased alertness, activity, or restlessness.  Movement of the head from side to side.  Opening of the mouth when the corner of the mouth or cheek is stroked (rooting).  Increased sucking sounds, smacking lips, cooing, sighing, or squeaking.  Hand-to-mouth movements and sucking on fingers or hands.  Fussing or crying. Avoid introducing a pacifier  to  your baby in the first 4-6 weeks after your baby is born. After this time, you may choose to use a pacifier. Research has shown that pacifier use during the first year of a baby's life decreases the risk of sudden infant death syndrome (SIDS). Allow your baby to feed on each breast as long as he or she wants. When your baby unlatches or falls asleep while feeding from the first breast, offer the second breast. Because newborns are often sleepy in the first few weeks of life, you may need to awaken your baby to get him or her to feed. Breastfeeding times will vary from baby to baby. However, the following rules can serve as a guide to help you make sure that your baby is properly fed:  Newborns (babies 31 weeks of age or younger) may breastfeed every 1-3 hours.  Newborns should not go without breastfeeding for longer than 3 hours during the day or 5 hours during the night.  You should breastfeed your baby a minimum of 8 times in a 24-hour period. Breast milk pumping     Pumping and storing breast milk allows you to make sure that your baby is exclusively fed your breast milk, even at times when you are unable to breastfeed. This is especially important if you go back to work while you are still breastfeeding, or if you are not able to be present during feedings. Your lactation consultant can help you find a method of pumping that works best for you and give you guidelines about how long it is safe to store breast milk. Caring for your breasts while you breastfeed Nipples can become dry, cracked, and sore while breastfeeding. The following recommendations can help keep your breasts moisturized and healthy:  Avoid using soap on your nipples.  Wear a supportive bra designed especially for nursing. Avoid wearing underwire-style bras or extremely tight bras (sports bras).  Air-dry your nipples for 3-4 minutes after each feeding.  Use only cotton bra pads to absorb leaked breast milk. Leaking of  breast milk between feedings is normal.  Use lanolin on your nipples after breastfeeding. Lanolin helps to maintain your skin's normal moisture barrier. Pure lanolin is not harmful (not toxic) to your baby. You may also hand express a few drops of breast milk and gently massage that milk into your nipples and allow the milk to air-dry. In the first few weeks after giving birth, some women experience breast engorgement. Engorgement can make your breasts feel heavy, warm, and tender to the touch. Engorgement peaks within 3-5 days after you give birth. The following recommendations can help to ease engorgement:  Completely empty your breasts while breastfeeding or pumping. You may want to start by applying warm, moist heat (in the shower or with warm, water-soaked hand towels) just before feeding or pumping. This increases circulation and helps the milk flow. If your baby does not completely empty your breasts while breastfeeding, pump any extra milk after he or she is finished.  Apply ice packs to your breasts immediately after breastfeeding or pumping, unless this is too uncomfortable for you. To do this: ? Put ice in a plastic bag. ? Place a towel between your skin and the bag. ? Leave the ice on for 20 minutes, 2-3 times a day.  Make sure that your baby is latched on and positioned properly while breastfeeding. If engorgement persists after 48 hours of following these recommendations, contact your health care provider or a Advertising copywriter. Overall health  care recommendations while breastfeeding  Eat 3 healthy meals and 3 snacks every day. Well-nourished mothers who are breastfeeding need an additional 450-500 calories a day. You can meet this requirement by increasing the amount of a balanced diet that you eat.  Drink enough water to keep your urine pale yellow or clear.  Rest often, relax, and continue to take your prenatal vitamins to prevent fatigue, stress, and low vitamin and mineral  levels in your body (nutrient deficiencies).  Do not use any products that contain nicotine or tobacco, such as cigarettes and e-cigarettes. Your baby may be harmed by chemicals from cigarettes that pass into breast milk and exposure to secondhand smoke. If you need help quitting, ask your health care provider.  Avoid alcohol.  Do not use illegal drugs or marijuana.  Talk with your health care provider before taking any medicines. These include over-the-counter and prescription medicines as well as vitamins and herbal supplements. Some medicines that may be harmful to your baby can pass through breast milk.  It is possible to become pregnant while breastfeeding. If birth control is desired, ask your health care provider about options that will be safe while breastfeeding your baby. Where to find more information: [employer]: www.llli.org Contact a health care provider if:  You feel like you want to stop breastfeeding or have become frustrated with breastfeeding.  Your nipples are cracked or bleeding.  Your breasts are red, tender, or warm.  You have: ? Painful breasts or nipples. ? A swollen area on either breast. ? A fever or chills. ? Nausea or vomiting. ? Drainage other than breast milk from your nipples.  Your breasts do not become full before feedings by the fifth day after you give birth.  You feel sad and depressed.  Your baby is: ? Too sleepy to eat well. ? Having trouble sleeping. ? More than 64 week old and wetting fewer than 6 diapers in a 24-hour period. ? Not gaining weight by 39 days of age.  Your baby has fewer than 3 stools in a 24-hour period.  Your baby's skin or the white parts of his or her eyes become yellow. Get help right away if:  Your baby is overly tired (lethargic) and does not want to wake up and feed.  Your baby develops an unexplained fever. Summary  Breastfeeding offers many health benefits for infant and mothers.  Try  to breastfeed your infant when he or she shows early signs of hunger.  Gently tickle or stroke your baby's lips with your finger or nipple to allow the baby to open his or her mouth. Bring the baby to your breast. Make sure that much of the areola is in your baby's mouth. Offer one side and burp the baby before you offer the other side.  Talk with your health care provider or lactation consultant if you have questions or you face problems as you breastfeed. This information is not intended to replace advice given to you by your health care provider. Make sure you discuss any questions you have with your health care provider. Document Revised: 12/12/2017 Document Reviewed: 10/19/2016 Elsevier Patient Education  2020 [employer].    Contraception Choices Contraception, also called birth control, refers to methods or devices that prevent pregnancy. Hormonal methods Contraceptive implant  A contraceptive implant is a thin, plastic tube that contains a hormone. It is inserted into the upper part of the arm. It can remain in place for up to 3 years. Progestin-only  injections Progestin-only injections are injections of progestin, a synthetic form of the hormone progesterone. They are given every 3 months by a health care provider. Birth control pills  Birth control pills are pills that contain hormones that prevent pregnancy. They must be taken once a day, preferably at the same time each day. Birth control patch  The birth control patch contains hormones that prevent pregnancy. It is placed on the skin and must be changed once a week for three weeks and removed on the fourth week. A prescription is needed to use this method of contraception. Vaginal ring  A vaginal ring contains hormones that prevent pregnancy. It is placed in the vagina for three weeks and removed on the fourth week. After that, the process is repeated with a new ring. A prescription is needed to use this method of  contraception. Emergency contraceptive Emergency contraceptives prevent pregnancy after unprotected sex. They come in pill form and can be taken up to 5 days after sex. They work best the sooner they are taken after having sex. Most emergency contraceptives are available without a prescription. This method should not be used as your only form of birth control. Barrier methods Female condom  A female condom is a thin sheath that is worn over the penis during sex. Condoms keep sperm from going inside a woman's body. They can be used with a spermicide to increase their effectiveness. They should be disposed after a single use. Female condom  A female condom is a soft, loose-fitting sheath that is put into the vagina before sex. The condom keeps sperm from going inside a woman's body. They should be disposed after a single use. Diaphragm  A diaphragm is a soft, dome-shaped barrier. It is inserted into the vagina before sex, along with a spermicide. The diaphragm blocks sperm from entering the uterus, and the spermicide kills sperm. A diaphragm should be left in the vagina for 6-8 hours after sex and removed within 24 hours. A diaphragm is prescribed and fitted by a health care provider. A diaphragm should be replaced every 1-2 years, after giving birth, after gaining more than 15 lb (6.8 kg), and after pelvic surgery. Cervical cap  A cervical cap is a round, soft latex or plastic cup that fits over the cervix. It is inserted into the vagina before sex, along with spermicide. It blocks sperm from entering the uterus. The cap should be left in place for 6-8 hours after sex and removed within 48 hours. A cervical cap must be prescribed and fitted by a health care provider. It should be replaced every 2 years. Sponge  A sponge is a soft, circular piece of polyurethane foam with spermicide on it. The sponge helps block sperm from entering the uterus, and the spermicide kills sperm. To use it, you make it wet  and then insert it into the vagina. It should be inserted before sex, left in for at least 6 hours after sex, and removed and thrown away within 30 hours. Spermicides Spermicides are chemicals that kill or block sperm from entering the cervix and uterus. They can come as a cream, jelly, suppository, foam, or tablet. A spermicide should be inserted into the vagina with an applicator at least 10-15 minutes before sex to allow time for it to work. The process must be repeated every time you have sex. Spermicides do not require a prescription. Intrauterine contraception Intrauterine device (IUD) An IUD is a T-shaped device that is put in a woman's uterus. There  are two types:  Hormone IUD.This type contains progestin, a synthetic form of the hormone progesterone. This type can stay in place for 3-5 years.  Copper IUD.This type is wrapped in copper wire. It can stay in place for 10 years.  Permanent methods of contraception Female tubal ligation In this method, a woman's fallopian tubes are sealed, tied, or blocked during surgery to prevent eggs from traveling to the uterus. Hysteroscopic sterilization In this method, a small, flexible insert is placed into each fallopian tube. The inserts cause scar tissue to form in the fallopian tubes and block them, so sperm cannot reach an egg. The procedure takes about 3 months to be effective. Another form of birth control must be used during those 3 months. Female sterilization This is a procedure to tie off the tubes that carry sperm (vasectomy). After the procedure, the man can still ejaculate fluid (semen). Natural planning methods Natural family planning In this method, a couple does not have sex on days when the woman could become pregnant. Calendar method This means keeping track of the length of each menstrual cycle, identifying the days when pregnancy can happen, and not having sex on those days. Ovulation method In this method, a couple avoids sex  during ovulation. Symptothermal method This method involves not having sex during ovulation. The woman typically checks for ovulation by watching changes in her temperature and in the consistency of cervical mucus. Post-ovulation method In this method, a couple waits to have sex until after ovulation. Summary  Contraception, also called birth control, means methods or devices that prevent pregnancy.  Hormonal methods of contraception include implants, injections, pills, patches, vaginal rings, and emergency contraceptives.  Barrier methods of contraception can include female condoms, female condoms, diaphragms, cervical caps, sponges, and spermicides.  There are two types of IUDs (intrauterine devices). An IUD can be put in a woman's uterus to prevent pregnancy for 3-5 years.  Permanent sterilization can be done through a procedure for males, females, or both.  Natural family planning methods involve not having sex on days when the woman could become pregnant. This information is not intended to replace advice given to you by your health care provider. Make sure you discuss any questions you have with your health care provider. Document Revised: 09/19/2017 Document Reviewed: 10/20/2016 Elsevier Patient Education  2020 [employer].

## 2020-03-30 ENCOUNTER — Encounter: Payer: Self-pay | Admitting: Obstetrics and Gynecology

## 2020-03-30 ENCOUNTER — Ambulatory Visit (INDEPENDENT_AMBULATORY_CARE_PROVIDER_SITE_OTHER): Payer: BC Managed Care – PPO | Admitting: Obstetrics and Gynecology

## 2020-03-30 ENCOUNTER — Other Ambulatory Visit: Payer: Self-pay

## 2020-03-30 ENCOUNTER — Other Ambulatory Visit: Payer: BC Managed Care – PPO

## 2020-03-30 VITALS — BP 104/71 | HR 94 | Wt 219.4 lb

## 2020-03-30 DIAGNOSIS — Z3482 Encounter for supervision of other normal pregnancy, second trimester: Secondary | ICD-10-CM | POA: Diagnosis not present

## 2020-03-30 DIAGNOSIS — Z3A26 26 weeks gestation of pregnancy: Secondary | ICD-10-CM

## 2020-03-30 LAB — POCT URINALYSIS DIPSTICK OB
Bilirubin, UA: NEGATIVE
Blood, UA: NEGATIVE
Glucose, UA: NEGATIVE
Ketones, UA: NEGATIVE
Nitrite, UA: NEGATIVE
POC,PROTEIN,UA: NEGATIVE
Spec Grav, UA: 1.01 (ref 1.010–1.025)
Urobilinogen, UA: 0.2 E.U./dL
pH, UA: 8 (ref 5.0–8.0)

## 2020-03-30 MED ORDER — TETANUS-DIPHTH-ACELL PERTUSSIS 5-2.5-18.5 LF-MCG/0.5 IM SUSP
0.5000 mL | Freq: Once | INTRAMUSCULAR | Status: AC
  Administered 2020-03-30: 0.5 mL via INTRAMUSCULAR

## 2020-03-30 NOTE — Progress Notes (Signed)
ROB: Patient doing well, no complaints.  Was seen in triage 2 days ago for pelvic cramping and possible LOF, but was negative. Notes she is doing better now. For 28 week labs today.  Desires to breastfeed.  Desires unsure method for contraception (given handout, to discuss at future visits). For Tdap today, signed blood consent, Patient does note that she received her first COVID vaccination last week.   Patient expresses that she is interested in waterbirth. Given brief overview, informed that she can discuss further with the midwives. Will return in 2 weeks for consultation.    The following were addressed during this visit:  Breastfeeding Education - Early initiation of breastfeeding    Comments: Keeps milk supply adequate, helps contract uterus and slow bleeding, and early milk is the perfect first food and is easy to digest.   - The importance of exclusive breastfeeding    Comments: Provides antibodies, Lower risk of breast and ovarian cancers, and type-2 diabetes,Helps your body recover, Reduced chance of SIDS.   - Risks of giving your baby anything other than breast milk if you are breastfeeding    Comments: Make the baby less content with breastfeeds, may make my baby more susceptible to illness, and may reduce my milk supply.   - The importance of early skin-to-skin contact    Comments: Keeps baby warm and secure, helps keep baby's blood sugar up and breathing steady, easier to bond and breastfeed, and helps calm baby.  - Rooming-in on a 24-hour basis    Comments: Easier to learn baby's feeding cues, easier to bond and get to know each other, and encourages milk production.   - Feeding on demand or baby-led feeding    Comments: Helps prevent breastfeeding complications, helps bring in good milk supply, prevents under or overfeeding, and helps baby feel content and satisfied   - Frequent feeding to help assure optimal milk production    Comments: Making a full supply of milk  requires frequent removal of milk from breasts, infant will eat 8-12 times in 24 hours, if separated from infant use breast massage, hand expression and/ or pumping to remove milk from breasts.

## 2020-03-31 LAB — CBC
Hematocrit: 33.6 % — ABNORMAL LOW (ref 34.0–46.6)
Hemoglobin: 11.2 g/dL (ref 11.1–15.9)
MCH: 30.5 pg (ref 26.6–33.0)
MCHC: 33.3 g/dL (ref 31.5–35.7)
MCV: 92 fL (ref 79–97)
Platelets: 236 10*3/uL (ref 150–450)
RBC: 3.67 x10E6/uL — ABNORMAL LOW (ref 3.77–5.28)
RDW: 11.9 % (ref 11.7–15.4)
WBC: 10.6 10*3/uL (ref 3.4–10.8)

## 2020-03-31 LAB — RPR: RPR Ser Ql: NONREACTIVE

## 2020-03-31 LAB — GLUCOSE, 1 HOUR GESTATIONAL: Gestational Diabetes Screen: 113 mg/dL (ref 65–139)

## 2020-04-15 ENCOUNTER — Other Ambulatory Visit: Payer: Self-pay

## 2020-04-15 ENCOUNTER — Ambulatory Visit (INDEPENDENT_AMBULATORY_CARE_PROVIDER_SITE_OTHER): Payer: BC Managed Care – PPO | Admitting: Certified Nurse Midwife

## 2020-04-15 VITALS — BP 96/59 | HR 82 | Wt 223.4 lb

## 2020-04-15 DIAGNOSIS — O26893 Other specified pregnancy related conditions, third trimester: Secondary | ICD-10-CM

## 2020-04-15 DIAGNOSIS — Z3493 Encounter for supervision of normal pregnancy, unspecified, third trimester: Secondary | ICD-10-CM

## 2020-04-15 DIAGNOSIS — Z3A28 28 weeks gestation of pregnancy: Secondary | ICD-10-CM

## 2020-04-15 DIAGNOSIS — M533 Sacrococcygeal disorders, not elsewhere classified: Secondary | ICD-10-CM

## 2020-04-15 DIAGNOSIS — R102 Pelvic and perineal pain: Secondary | ICD-10-CM

## 2020-04-15 LAB — POCT URINALYSIS DIPSTICK OB
Bilirubin, UA: NEGATIVE
Blood, UA: NEGATIVE
Glucose, UA: NEGATIVE
Ketones, UA: NEGATIVE
Leukocytes, UA: NEGATIVE
Nitrite, UA: NEGATIVE
POC,PROTEIN,UA: NEGATIVE
Spec Grav, UA: <=1.005 — AB (ref 1.010–1.025)
Urobilinogen, UA: 0.2 E.U./dL
pH, UA: 5 (ref 5.0–8.0)

## 2020-04-15 NOTE — Patient Instructions (Signed)
Fetal Movement Counts Patient Name: ________________________________________________ Patient Due Date: ____________________ What is a fetal movement count?  A fetal movement count is the number of times that you feel your baby move during a certain amount of time. This may also be called a fetal kick count. A fetal movement count is recommended for every pregnant woman. You may be asked to start counting fetal movements as early as week 28 of your pregnancy. Pay attention to when your baby is most active. You may notice your baby's sleep and wake cycles. You may also notice things that make your baby move more. You should do a fetal movement count:  When your baby is normally most active.  At the same time each day. A good time to count movements is while you are resting, after having something to eat and drink. How do I count fetal movements? 1. Find a quiet, comfortable area. Sit, or lie down on your side. 2. Write down the date, the start time and stop time, and the number of movements that you felt between those two times. Take this information with you to your health care visits. 3. Write down your start time when you feel the first movement. 4. Count kicks, flutters, swishes, rolls, and jabs. You should feel at least 10 movements. 5. You may stop counting after you have felt 10 movements, or if you have been counting for 2 hours. Write down the stop time. 6. If you do not feel 10 movements in 2 hours, contact your health care provider for further instructions. Your health care provider may want to do additional tests to assess your baby's well-being. Contact a health care provider if:  You feel fewer than 10 movements in 2 hours.  Your baby is not moving like he or she usually does. Date: ____________ Start time: ____________ Stop time: ____________ Movements: ____________ Date: ____________ Start time: ____________ Stop time: ____________ Movements: ____________ Date: ____________  Start time: ____________ Stop time: ____________ Movements: ____________ Date: ____________ Start time: ____________ Stop time: ____________ Movements: ____________ Date: ____________ Start time: ____________ Stop time: ____________ Movements: ____________ Date: ____________ Start time: ____________ Stop time: ____________ Movements: ____________ Date: ____________ Start time: ____________ Stop time: ____________ Movements: ____________ Date: ____________ Start time: ____________ Stop time: ____________ Movements: ____________ Date: ____________ Start time: ____________ Stop time: ____________ Movements: ____________ This information is not intended to replace advice given to you by your health care provider. Make sure you discuss any questions you have with your health care provider. Document Revised: 05/07/2019 Document Reviewed: 05/07/2019 Elsevier Patient Education  2020 [employer]   Third Trimester of Pregnancy  The third trimester is from week 28 through week 40 (months 7 through 9). This trimester is when your unborn baby (fetus) is growing very fast. At the end of the ninth month, the unborn baby is about 20 inches in length. It weighs about 6-10 pounds. Follow these instructions at home: Medicines  Take over-the-counter and prescription medicines only as told by your doctor. Some medicines are safe and some medicines are not safe during pregnancy.  Take a prenatal vitamin that contains at least 600 micrograms (mcg) of folic acid.  If you have trouble pooping (constipation), take medicine that will make your stool soft (stool softener) if your doctor approves. Eating and drinking   Eat regular, healthy meals.  Avoid raw meat and uncooked cheese.  If you get low calcium from the food you eat, talk to your doctor about taking a daily calcium supplement.    Eat four or five small meals rather than three large meals a day.  Avoid foods that are high in fat and sugars, such as  fried and sweet foods.  To prevent constipation: ? Eat foods that are high in fiber, like fresh fruits and vegetables, whole grains, and beans. ? Drink enough fluids to keep your pee (urine) clear or pale yellow. Activity  Exercise only as told by your doctor. Stop exercising if you start to have cramps.  Avoid heavy lifting, wear low heels, and sit up straight.  Do not exercise if it is too hot, too humid, or if you are in a place of great height (high altitude).  You may continue to have sex unless your doctor tells you not to. Relieving pain and discomfort  Wear a good support bra if your breasts are tender.  Take frequent breaks and rest with your legs raised if you have leg cramps or low back pain.  Take warm water baths (sitz baths) to soothe pain or discomfort caused by hemorrhoids. Use hemorrhoid cream if your doctor approves.  If you develop puffy, bulging veins (varicose veins) in your legs: ? Wear support hose or compression stockings as told by your doctor. ? Raise (elevate) your feet for 15 minutes, 3-4 times a day. ? Limit salt in your food. Safety  Wear your seat belt when driving.  Make a list of emergency phone numbers, including numbers for family, friends, the hospital, and police and fire departments. Preparing for your baby's arrival To prepare for the arrival of your baby:  Take prenatal classes.  Practice driving to the hospital.  Visit the hospital and tour the maternity area.  Talk to your work about taking leave once the baby comes.  Pack your hospital bag.  Prepare the baby's room.  Go to your doctor visits.  Buy a rear-facing car seat. Learn how to install it in your car. General instructions  Do not use hot tubs, steam rooms, or saunas.  Do not use any products that contain nicotine or tobacco, such as cigarettes and e-cigarettes. If you need help quitting, ask your doctor.  Do not drink alcohol.  Do not douche or use tampons or  scented sanitary pads.  Do not cross your legs for long periods of time.  Do not travel for long distances unless you must. Only do so if your doctor says it is okay.  Visit your dentist if you have not gone during your pregnancy. Use a soft toothbrush to brush your teeth. Be gentle when you floss.  Avoid cat litter boxes and soil used by cats. These carry germs that can cause birth defects in the baby and can cause a loss of your baby (miscarriage) or stillbirth.  Keep all your prenatal visits as told by your doctor. This is important. Contact a doctor if:  You are not sure if you are in labor or if your water has broken.  You are dizzy.  You have mild cramps or pressure in your lower belly.  You have a nagging pain in your belly area.  You continue to feel sick to your stomach, you throw up, or you have watery poop.  You have bad smelling fluid coming from your vagina.  You have pain when you pee. Get help right away if:  You have a fever.  You are leaking fluid from your vagina.  You are spotting or bleeding from your vagina.  You have severe belly cramps or pain.  You   lose or gain weight quickly.  You have trouble catching your breath and have chest pain.  You notice sudden or extreme puffiness (swelling) of your face, hands, ankles, feet, or legs.  You have not felt the baby move in over an hour.  You have severe headaches that do not go away with medicine.  You have trouble seeing.  You are leaking, or you are having a gush of fluid, from your vagina before you are 37 weeks.  You have regular belly spasms (contractions) before you are 37 weeks. Summary  The third trimester is from week 28 through week 40 (months 7 through 9). This time is when your unborn baby is growing very fast.  Follow your doctor's advice about medicine, food, and activity.  Get ready for the arrival of your baby by taking prenatal classes, getting all the baby items ready,  preparing the baby's room, and visiting your doctor to be checked.  Get help right away if you are bleeding from your vagina, or you have chest pain and trouble catching your breath, or if you have not felt your baby move in over an hour. This information is not intended to replace advice given to you by your health care provider. Make sure you discuss any questions you have with your health care provider. Document Revised: 01/08/2019 Document Reviewed: 10/23/2016 Elsevier Patient Education  2020 [employer]  

## 2020-04-17 NOTE — Progress Notes (Signed)
Waterbirth consultation. Patient presents from [facility] to discuss waterbirth with CNM. Questions answered and waterbirth criteria discussed in detail; handouts provided. Encouraged to register for classes. Reports coccyx pain, given home treatment measures and referred to pelvic floor physical therapy. Anticipatory guidance regarding course of prenatal care. Reviewed red flag symptoms and when to call. RTC x 2 weeks for ROB with ANNIE then may return to [facility] care if waterbirth no longer desired.

## 2020-04-27 ENCOUNTER — Encounter: Payer: BC Managed Care – PPO | Admitting: Certified Nurse Midwife

## 2020-04-27 ENCOUNTER — Ambulatory Visit: Payer: BC Managed Care – PPO | Attending: Certified Nurse Midwife | Admitting: Physical Therapy

## 2020-04-27 ENCOUNTER — Encounter: Payer: Self-pay | Admitting: Physical Therapy

## 2020-04-27 ENCOUNTER — Other Ambulatory Visit: Payer: Self-pay

## 2020-04-27 DIAGNOSIS — R278 Other lack of coordination: Secondary | ICD-10-CM | POA: Diagnosis not present

## 2020-04-27 DIAGNOSIS — R293 Abnormal posture: Secondary | ICD-10-CM

## 2020-04-27 DIAGNOSIS — M533 Sacrococcygeal disorders, not elsewhere classified: Secondary | ICD-10-CM | POA: Diagnosis not present

## 2020-04-27 NOTE — Therapy (Signed)
[facility] [address] [place], [place], [address] Phone: [phone]   Fax:  [phone]  Physical Therapy Evaluation  Patient Details  Name: Karen Hanna MRN: [id number] Date of Birth: 05-06-1991 Referring Provider (PT): Jeralyn BennettLawhorn   Encounter Date: 04/27/2020   PT End of Session - 04/27/20 0757    Visit Number 1    Number of Visits 8    Date for PT Re-Evaluation 06/22/20    Authorization Type IE 04/27/2020    PT Start Time 0800    PT Stop Time 0855    PT Time Calculation (min) 55 min    Activity Tolerance Patient tolerated treatment well    Behavior During Therapy [facility] for tasks assessed/performed           Past Medical History:  Diagnosis Date  . Depression 06/25/2019    Past Surgical History:  Procedure Laterality Date  . APPENDECTOMY      There were no vitals filed for this visit.        [facility] - 04/27/20 0001      Assessment   Medical Diagnosis Coccyx pain during pregnancy    Referring Provider (PT) Lawhorn    Hand Dominance Right    Next MD Visit 05/04/2020    Prior Therapy None for this dx      Balance Screen   Has the patient fallen in the past 6 months No          PELVIC HEALTH PHYSICAL THERAPY EVALUATION  SCREENING Red Flags: None Have you had any night sweats? Unexplained weight loss? Saddle anesthesia? Unexplained changes in bowel or bladder habits?  Precautions: [redacted] weeks pregnant   SUBJECTIVE  Chief Complaint: Patient reports that she has tailbone and pubic symphysis pain. Tailbone pain is constant; she works at a [employer] and uses a small tailbone pillow which helps, but causes her legs to go numb. Patient notes that she cannot sit without pain increasing. Patient also notes pain that wraps from the ischial tuberosity to the inguinal region on the L side; the pain here is also constant. Pubic symphysis pain comes on with increased activity (walking/standing). Patient has hip  pain when laying on her side.  Patient states that she has been using a maternity brace which does help some.  Pertinent History:  Scoliosis Negative. Pulmonary disease/dysfunction Negative. Surgical history: Positive for laparoscopic appendectomy.    Obstetrical History: G2P0 Deliveries: none Tearing/Episiotomy: n/a Birthing position: n/a  Gynecological History: Pain with exam: Yes (with speculum insertion)  Urinary History: Incontinence: Positive. Onset: 2020 Triggers: SUI Amount: Mod/Complete Loss. Fluid Intake: 1/2 gallon + H20, occasional caffeinated, 8 oz juice Nocturia: 3x/night Frequency of urination: 9-12x/day Pain with urination: Negative Difficulty initiating urination: Negative Frequent UTI: Positive for childhood history.   Gastrointestinal History: [place] Stool Chart: Type 4-6 Frequency of BMs: 1x/day Pain with defecation: Negative Straining with defecation: Negative Incontinence: Negative.   Sexual activity/pain: Pain with intercourse: Positive for positional dependent. Pain after. Pain is worst in supine.  Initial penetration: No  Deep thrustingYes   Location of pain: coccyx, pubic symphysis Current pain:  5/10 ; 6/10 Max pain:  9/10; 9/10 Least pain:  0/10 Pain quality: pain quality: aching, cramping, pressure, sharp and stabbing Radiating pain: Yes (down legs R>L)   Current activities:  stretching  Patient Goals:  Less pain  Patient perception of overall health: excellent  OBJECTIVE  Mental Status Patient is oriented to person, place and time.  Recent  memory is intact.  Remote memory is intact.  Attention span and concentration are intact.  Expressive speech is intact.  Patient's fund of knowledge is within normal limits for educational level.  POSTURE/OBSERVATIONS:  Lumbar lordosis: increased commensurate with third trimester Iliac crest height: R elevated Lumbar lateral shift: negative Pelvic obliquity: R forward  Leg length  discrepancy: negative Palpation of lower lumbar is painful.   GAIT: Wide based, waddling gait. Increased pronation B. Decreased stride length.  RANGE OF MOTION: deferred 2/2 to irritability   LEFT RIGHT  Lumbar forward flexion (65):      Lumbar extension (30):     Lumbar lateral flexion (25):     Thoracic and Lumbar rotation (30 degrees):       Hip Flexion (0-125):      Hip IR (0-45):     Hip ER (0-45):     Hip Abduction (0-40):     Hip extension (0-15):       SENSATION: Grossly intact to light touch bilateral LEs as determined by testing dermatomes L2-S2 Proprioception and hot/cold testing deferred on this date  STRENGTH: MMT deferred 2/2 to irritability  RLE LLE  Hip Flexion    Hip Extension    Hip Abduction     Hip Adduction     Hip ER     Hip IR     Knee Extension    Knee Flexion    Dorsiflexion     Plantarflexion (seated)     ABDOMINAL: deferred 2/2 to time constraints Palpation: Diastasis: Scar mobility: Rib flare:  SPECIAL TESTS: Compression (SN/SP 69):Positive for symptom relief Stork/March (SP 93): R: Positive L: Positive  PHYSICAL PERFORMANCE MEASURES: STS: WFL (antalgic) Deep Squat: WFL  EXTERNAL PELVIC EXAM: deferred 2/2 to time constraints Palpation: Breath coordination: Cued Lengthen: Cued Contraction: Cough:  INTERNAL VAGINAL EXAM: deferred 2/2 to time constraints Introitus Appears:  Skin integrity:  Scar mobility: Strength (PERF):  Symmetry: Palpation: Prolapse:   INTERNAL RECTAL EXAM: deferred 2/2 to time constraints Strength (PERF): Symmetry: Palpation: Prolapse:   OUTCOME MEASURES: FOTO (Urinary 55, PFDI Pain 46)   ASSESSMENT Patient is a 29 year old presenting to clinic with chief complaints of urinary incontinence and pregnancy related pelvic girdle pain. Upon examination, patient demonstrates deficits in pain, posture, spinal mobility, PFM strength, PFM coordination as evidenced by urinary leakage with  coughing/laughing/sneezing, R IC elevation, R pelvic obliquity, increased lordosis, 9/10 pain in the last week, and positive Stork and Compression tests. Patient's responses on FOTO outcome measures (Urinary 55, PFDI Pain 46) indicate significant functional limitations/disability/distress. Patient's progress may be limited due to the progressive nature of pregnancy; however, patient's motivation is advantageous. Patient was able to achieve basic understanding of PFM function and postural changes occurring during pregnancy during today's evaluation and responded positively to educational interventions. Patient will benefit from continued skilled therapeutic intervention to address deficits in  pain, posture, spinal mobility, PFM strength, PFM coordination in order to increase function and improve overall QOL.  EDUCATION Patient educated on prognosis, POC, and provided with HEP including: pregnancy postural education handout/awareness. Patient articulated understanding and returned demonstration. Patient will benefit from further education in order to maximize compliance and understanding for long-term therapeutic gains.  TREATMENT  Neuromuscular Re-education: Patient educated on primary functions of the pelvic floor including: posture/balance, sexual pleasure, storage and elimination of waste from the body, abdominal cavity closure, and breath coordination.      Objective measurements completed on examination: See above findings.  PT Long Term Goals - 04/27/20 1618      PT LONG TERM GOAL #1   Title Patient will demonstrate independence with HEP in order to maximize therapeutic gains and improve carryover from physical therapy sessions to ADLs in the home and community.    Baseline IE: not initiated    Time 8    Period Weeks    Status New    Target Date 06/22/20      PT LONG TERM GOAL #2   Title Patient will demonstrate improved function as evidenced by a  score of 64 on FOTO Urinary measure for full participation in activities at home and in the community.    Baseline IE: 55    Time 8    Period Weeks    Status New    Target Date 06/22/20      PT LONG TERM GOAL #3   Title Patient will demonstrate improved function as evidenced by a score of 30 on FOTO-PFDI Pain measure for full participation in activities at home and in the community.    Baseline IE: 46    Time 8    Period Weeks    Status New    Target Date 06/22/20      PT LONG TERM GOAL #4   Title Patient will demonstrate proper body mechanics for improved stabilization of pelvic joints with transfers and lifts in order to manage pain and continue participation in activities at home and in the community.    Baseline IE: not demonstrated    Time 8    Period Weeks    Status New    Target Date 06/22/20      PT LONG TERM GOAL #5   Title Patient will decrease worst pain as reported on NPRS by at least 2 points to demonstrate clinically significant reduction in pain in order to restore/improve function and overall QOL.    Baseline IE: 9/10    Time 8    Period Weeks    Status New    Target Date 06/22/20                  Plan - 04/27/20 0758    Clinical Impression Statement Patient is a 29 year old presenting to clinic with chief complaints of urinary incontinence and pregnancy related pelvic girdle pain. Upon examination, patient demonstrates deficits in pain, posture, spinal mobility, PFM strength, PFM coordination as evidenced by urinary leakage with coughing/laughing/sneezing, R IC elevation, R pelvic obliquity, increased lordosis, 9/10 pain in the last week, and positive Stork and Compression tests. Patient's responses on FOTO outcome measures (Urinary 55, PFDI Pain 46) indicate significant functional limitations/disability/distress. Patient's progress may be limited due to the progressive nature of pregnancy; however, patient's motivation is advantageous. Patient was able to  achieve basic understanding of PFM function and postural changes occurring during pregnancy during today's evaluation and responded positively to educational interventions. Patient will benefit from continued skilled therapeutic intervention to address deficits in  pain, posture, spinal mobility, PFM strength, PFM coordination in order to increase function and improve overall QOL.    Personal Factors and Comorbidities Age;Comorbidity 2    Comorbidities anxiety, depression    Examination-Activity Limitations Sit;Transfers;Bed Mobility;Squat;Lift;Bend;Stairs;Stand;Continence    Examination-Participation Restrictions Interpersonal Relationship;Yard Work;Cleaning;Laundry;Community Activity;Driving;Shop    Stability/Clinical Decision Making Evolving/Moderate complexity    Clinical Decision Making Moderate    Rehab Potential Fair    PT Frequency 1x / week    PT Duration 8 weeks    PT Treatment/Interventions  ADLs/Self Care Home Management;Aquatic Therapy;Moist Heat;Cryotherapy;Gait training;Stair training;Therapeutic exercise;Balance training;Functional mobility training;Therapeutic activities;Neuromuscular re-education;Taping;Dry needling;Passive range of motion;Manual techniques;Patient/family education;Joint Manipulations;Spinal Manipulations    PT Next Visit Plan manual PRN; stabilization exercises    PT Home Exercise Plan pregnancy postural education handout/awareness    Consulted and Agree with Plan of Care Patient           Patient will benefit from skilled therapeutic intervention in order to improve the following deficits and impairments:  Abnormal gait, Decreased balance, Decreased endurance, Difficulty walking, Increased muscle spasms, Pain, Postural dysfunction, Improper body mechanics, Decreased strength, Decreased coordination, Decreased activity tolerance, Hypermobility  Visit Diagnosis: Coccyx pain  Abnormal posture  Other lack of coordination     Problem List Patient Active  Problem List   Diagnosis Date Noted  . Indication for care in labor or delivery 03/28/2020  . Abdominal cramping complicating pregnancy 03/28/2020  . History of depression 01/20/2020  . Gastroesophageal reflux in pregnancy in second trimester 01/20/2020  . Anxiety 11/18/2019    Class: Chronic  . Depression 06/25/2019   Sheria Lang PT, DPT [phone] 04/27/2020, 4:22 PM  [facility] [address] [place], [place], [address] Phone: [phone]   Fax:  [phone]  Name: Karen Hanna MRN: [id number] Date of Birth: 02/27/1991

## 2020-05-04 ENCOUNTER — Ambulatory Visit (INDEPENDENT_AMBULATORY_CARE_PROVIDER_SITE_OTHER): Payer: BC Managed Care – PPO | Admitting: Certified Nurse Midwife

## 2020-05-04 ENCOUNTER — Encounter: Payer: Self-pay | Admitting: Physical Therapy

## 2020-05-04 ENCOUNTER — Ambulatory Visit: Payer: BC Managed Care – PPO | Attending: Certified Nurse Midwife | Admitting: Physical Therapy

## 2020-05-04 ENCOUNTER — Telehealth: Payer: Self-pay | Admitting: Certified Nurse Midwife

## 2020-05-04 ENCOUNTER — Other Ambulatory Visit: Payer: Self-pay

## 2020-05-04 VITALS — BP 81/54 | HR 84 | Wt 223.6 lb

## 2020-05-04 DIAGNOSIS — Z3A31 31 weeks gestation of pregnancy: Secondary | ICD-10-CM

## 2020-05-04 DIAGNOSIS — M533 Sacrococcygeal disorders, not elsewhere classified: Secondary | ICD-10-CM | POA: Insufficient documentation

## 2020-05-04 DIAGNOSIS — R278 Other lack of coordination: Secondary | ICD-10-CM | POA: Insufficient documentation

## 2020-05-04 DIAGNOSIS — R293 Abnormal posture: Secondary | ICD-10-CM

## 2020-05-04 LAB — POCT URINALYSIS DIPSTICK OB
Bilirubin, UA: NEGATIVE
Blood, UA: NEGATIVE
Glucose, UA: NEGATIVE
Ketones, UA: NEGATIVE
Leukocytes, UA: NEGATIVE
Nitrite, UA: NEGATIVE
POC,PROTEIN,UA: NEGATIVE
Spec Grav, UA: 1.01 (ref 1.010–1.025)
Urobilinogen, UA: 0.2 E.U./dL
pH, UA: 5 (ref 5.0–8.0)

## 2020-05-04 NOTE — Patient Instructions (Signed)
[place] Pediatrician List  [facility]  [address], [place], [place] [address]  Phone: [phone]  [facility] (second location)  [address] [place], [place] [address]  Phone: [phone]  [facility] [address], [place], [place] [address] Phone: [phone]  [facility]  [address] , [place] [address]  Phone: [phone] 

## 2020-05-04 NOTE — Therapy (Signed)
[facility] [address] [place], [place], [address] Phone: [phone]   Fax:  [phone]  Physical Therapy Treatment  Patient Details  Name: Karen Hanna MRN: [id number] Date of Birth: [birth year] Referring Provider (PT): Jeralyn Bennett   Encounter Date: 05/04/2020   PT End of Session - 05/04/20 0810    Visit Number 2    Number of Visits 8    Date for PT Re-Evaluation 06/22/20    Authorization Type IE 04/27/2020    PT Start Time 0809    PT Stop Time 0850    PT Time Calculation (min) 41 min    Activity Tolerance Patient tolerated treatment well    Behavior During Therapy [facility] for tasks assessed/performed           Past Medical History:  Diagnosis Date   Depression 06/25/2019    Past Surgical History:  Procedure Laterality Date   APPENDECTOMY      There were no vitals filed for this visit.   Subjective Assessment - 05/04/20 0808    Subjective Patient presents to clinic ~10 minutes late for her appointment. Patient notes that she has been wearing her maternity belly band lower for compression over the SIJ which has helped. Patient adds that pain now is not bad, but continues to be more intense at night with sleeping.    Currently in Pain? Yes    Pain Score 5     Pain Location Pelvis           TREATMENT  Neuromuscular Re-education: Patient education on sleeping posture and strategies to minimize torsion forces on spine and pelvis for decreased muscle spasm/pain/discomfort. Patient demonstration of appropriately supported sleeping posture. Sidelying, diaphragmatic breathing for nervous system downtraining and improved PFM coordination Seated heel squeezes for improved force closure of pelvis and posterior pelvic stability Seated hip adduction squeezes for improved force closure of pelvis and posterior pelvic stability   Patient educated throughout session on appropriate technique and form using multi-modal cueing, HEP,  and activity modification. Patient articulated understanding and returned demonstration.  Patient Response to interventions: Denies increased pain; notes relief from force closure activities.  ASSESSMENT Patient presents to clinic with excellent motivation to participate in therapy. Patient demonstrates deficits in pain, posture, spinal mobility, PFM strength, PFM coordination. Patient able to achieve improved comfort with sleeping posture when using appropriate supports for minimized torsion forces during today's session and responded positively to educational and active interventions. Patient will benefit from continued skilled therapeutic intervention to address remaining deficits in pain, posture, spinal mobility, PFM strength, PFM coordination in order to increase function and improve overall QOL.     PT Long Term Goals - 04/27/20 1618      PT LONG TERM GOAL #1   Title Patient will demonstrate independence with HEP in order to maximize therapeutic gains and improve carryover from physical therapy sessions to ADLs in the home and community.    Baseline IE: not initiated    Time 8    Period Weeks    Status New    Target Date 06/22/20      PT LONG TERM GOAL #2   Title Patient will demonstrate improved function as evidenced by a score of 64 on FOTO Urinary measure for full participation in activities at home and in the community.    Baseline IE: 55    Time 8    Period Weeks    Status New    Target Date 06/22/20  PT LONG TERM GOAL #3   Title Patient will demonstrate improved function as evidenced by a score of 30 on FOTO-PFDI Pain measure for full participation in activities at home and in the community.    Baseline IE: 46    Time 8    Period Weeks    Status New    Target Date 06/22/20      PT LONG TERM GOAL #4   Title Patient will demonstrate proper body mechanics for improved stabilization of pelvic joints with transfers and lifts in order to manage pain and continue  participation in activities at home and in the community.    Baseline IE: not demonstrated    Time 8    Period Weeks    Status New    Target Date 06/22/20      PT LONG TERM GOAL #5   Title Patient will decrease worst pain as reported on NPRS by at least 2 points to demonstrate clinically significant reduction in pain in order to restore/improve function and overall QOL.    Baseline IE: 9/10    Time 8    Period Weeks    Status New    Target Date 06/22/20                 Plan - 05/04/20 0826    Clinical Impression Statement Patient presents to clinic with excellent motivation to participate in therapy. Patient demonstrates deficits in pain, posture, spinal mobility, PFM strength, PFM coordination. Patient able to achieve improved comfort with sleeping posture when using appropriate supports for minimized torsion forces during today's session and responded positively to educational and active interventions. Patient will benefit from continued skilled therapeutic intervention to address remaining deficits in pain, posture, spinal mobility, PFM strength, PFM coordination in order to increase function and improve overall QOL.    Personal Factors and Comorbidities Age;Comorbidity 2    Comorbidities anxiety, depression    Examination-Activity Limitations Sit;Transfers;Bed Mobility;Squat;Lift;Bend;Stairs;Stand;Continence    Examination-Participation Restrictions Interpersonal Relationship;Yard Work;Cleaning;Laundry;Community Activity;Driving;Shop    Stability/Clinical Decision Making Evolving/Moderate complexity    Rehab Potential Fair    PT Frequency 1x / week    PT Duration 8 weeks    PT Treatment/Interventions ADLs/Self Care Home Management;Aquatic Therapy;Moist Heat;Cryotherapy;Gait training;Stair training;Therapeutic exercise;Balance training;Functional mobility training;Therapeutic activities;Neuromuscular re-education;Taping;Dry needling;Passive range of motion;Manual  techniques;Patient/family education;Joint Manipulations;Spinal Manipulations    PT Next Visit Plan manual PRN; stabilization exercises    PT Home Exercise Plan pregnancy postural education handout/awareness    Consulted and Agree with Plan of Care Patient           Patient will benefit from skilled therapeutic intervention in order to improve the following deficits and impairments:  Abnormal gait, Decreased balance, Decreased endurance, Difficulty walking, Increased muscle spasms, Pain, Postural dysfunction, Improper body mechanics, Decreased strength, Decreased coordination, Decreased activity tolerance, Hypermobility  Visit Diagnosis: Coccyx pain  Abnormal posture  Other lack of coordination     Problem List Patient Active Problem List   Diagnosis Date Noted   Indication for care in labor or delivery 03/28/2020   Abdominal cramping complicating pregnancy 03/28/2020   History of depression 01/20/2020   Gastroesophageal reflux in pregnancy in second trimester 01/20/2020   Anxiety 11/18/2019    Class: Chronic   Depression 06/25/2019   Sheria Lang PT, DPT [phone] 05/04/2020, 1:25 PM  [facility] [address] [place], [place], [address] Phone: [phone]   Fax:  [phone]  Name: JENNALEE GREAVES MRN: [id number] Date  of Birth: January 05, 1991

## 2020-05-04 NOTE — Progress Notes (Signed)
Rob doing well. Feels good fetal movement. Discussed water birth plans, She has class scheduled tonight. She will bring in quiz and certificate to next appointment. Consent for water birth completed today. Copy scanned to chart. Pt given copy as well. Discussed ready set baby, remainder of topics reviewed. She check list. Follow up 2 wks with Marcelino Duster.   Doreene Burke, CNM

## 2020-05-04 NOTE — Telephone Encounter (Signed)
Patients husband called in wanting more information for his wife on the water birth process. Could you please advise?

## 2020-05-05 NOTE — Telephone Encounter (Signed)
Telephone call to patient, verified full name and date of birth.   Original call was yesterday with questions regarding enrolling in waterbirth class; however, patient and spouse were able to enroll without difficulty.   No further assistance needed.    Serafina Royals, CNM [facility] 05/05/20 1:01 PM

## 2020-05-10 ENCOUNTER — Ambulatory Visit: Payer: BC Managed Care – PPO | Admitting: Physical Therapy

## 2020-05-11 ENCOUNTER — Other Ambulatory Visit: Payer: Self-pay

## 2020-05-11 ENCOUNTER — Ambulatory Visit: Payer: BC Managed Care – PPO | Admitting: Physical Therapy

## 2020-05-11 ENCOUNTER — Encounter: Payer: Self-pay | Admitting: Physical Therapy

## 2020-05-11 DIAGNOSIS — M533 Sacrococcygeal disorders, not elsewhere classified: Secondary | ICD-10-CM

## 2020-05-11 DIAGNOSIS — R278 Other lack of coordination: Secondary | ICD-10-CM

## 2020-05-11 DIAGNOSIS — R293 Abnormal posture: Secondary | ICD-10-CM

## 2020-05-11 NOTE — Therapy (Signed)
[facility] [address] [place], [place], [address] Phone: [phone]   Fax:  [phone]  Physical Therapy Treatment  Patient Details  Name: Karen Hanna MRN: [id number] Date of Birth: 1990-11-27 Referring Provider (PT): Verdene Rio   Encounter Date: 05/11/2020   PT End of Session - 05/11/20 1410    Visit Number 3    Number of Visits 8    Date for PT Re-Evaluation 06/22/20    Authorization Type IE 04/27/2020    PT Start Time 1400    PT Stop Time 1455    PT Time Calculation (min) 55 min    Activity Tolerance Patient tolerated treatment well    Behavior During Therapy [facility] for tasks assessed/performed           Past Medical History:  Diagnosis Date  . Depression 06/25/2019    Past Surgical History:  Procedure Laterality Date  . APPENDECTOMY      There were no vitals filed for this visit.   Subjective Assessment - 05/11/20 1408    Subjective Patient notes that she has increased L SIJ pain with radiation to lateral hip. Patient notes that she was helping a friend and watching kids which involved a lot of repeated L rotation with reaching behind the body. She has used bengay and stretches, but nothing is bringing the pain down.    Currently in Pain? Yes    Pain Score 7     Pain Location Sacrum    Pain Orientation Left    Pain Descriptors / Indicators Sore;Sharp           TREATMENT  Manual Therapy: STM and TPR performed to L gluteal complex and L QL to allow for decreased tension and pain and improved posture and function L innominate anterior mobilizations for decreased spasm and improved mobility, grade II/III   Neuromuscular Re-education: Sidelying, diaphragmatic breathing for nervous system downtraining and improved PFM coordination  Seated pelvic MET, L bias, for decreased pain and rotation at L SIJ Patient education on strategies for optimizing pelvic biomechanics during pregnancy, minimizing risk of prolapse,  and positions to prepare for birth.  Patient educated throughout session on appropriate technique and form using multi-modal cueing, HEP, and activity modification. Patient articulated understanding and returned demonstration.  Patient Response to interventions: Reports 4-5/10 pain at end of session.  ASSESSMENT Patient presents to clinic with excellent motivation to participate in therapy. Patient demonstrates deficits in pain, posture, spinal mobility, PFM strength, PFM coordination. Patient able to achieve significant pain reduction with manual and neuromuscular re-ed activities during today's session and responded positively to educational and active interventions. Patient will benefit from continued skilled therapeutic intervention to address remaining deficits in pain, posture, spinal mobility, PFM strength, PFM coordination in order to increase function and improve overall QOL.     PT Long Term Goals - 04/27/20 1618      PT LONG TERM GOAL #1   Title Patient will demonstrate independence with HEP in order to maximize therapeutic gains and improve carryover from physical therapy sessions to ADLs in the home and community.    Baseline IE: not initiated    Time 8    Period Weeks    Status New    Target Date 06/22/20      PT LONG TERM GOAL #2   Title Patient will demonstrate improved function as evidenced by a score of 64 on FOTO Urinary measure for full participation in activities at home and in the  community.    Baseline IE: 59    Time 8    Period Weeks    Status New    Target Date 06/22/20      PT LONG TERM GOAL #3   Title Patient will demonstrate improved function as evidenced by a score of 30 on FOTO-PFDI Pain measure for full participation in activities at home and in the community.    Baseline IE: 46    Time 8    Period Weeks    Status New    Target Date 06/22/20      PT LONG TERM GOAL #4   Title Patient will demonstrate proper body mechanics for improved stabilization  of pelvic joints with transfers and lifts in order to manage pain and continue participation in activities at home and in the community.    Baseline IE: not demonstrated    Time 8    Period Weeks    Status New    Target Date 06/22/20      PT LONG TERM GOAL #5   Title Patient will decrease worst pain as reported on NPRS by at least 2 points to demonstrate clinically significant reduction in pain in order to restore/improve function and overall QOL.    Baseline IE: 9/10    Time 8    Period Weeks    Status New    Target Date 06/22/20                 Plan - 05/11/20 1410    Clinical Impression Statement Patient presents to clinic with excellent motivation to participate in therapy. Patient demonstrates deficits in pain, posture, spinal mobility, PFM strength, PFM coordination. Patient able to achieve significant pain reduction with manual and neuromuscular re-ed activities during today's session and responded positively to educational and active interventions. Patient will benefit from continued skilled therapeutic intervention to address remaining deficits in pain, posture, spinal mobility, PFM strength, PFM coordination in order to increase function and improve overall QOL.    Personal Factors and Comorbidities Age;Comorbidity 2    Comorbidities anxiety, depression    Examination-Activity Limitations Sit;Transfers;Bed Mobility;Squat;Lift;Bend;Stairs;Stand;Continence    Examination-Participation Restrictions Interpersonal Relationship;Yard Work;Cleaning;Laundry;Community Activity;Driving;Shop    Stability/Clinical Decision Making Evolving/Moderate complexity    Rehab Potential Fair    PT Frequency 1x / week    PT Duration 8 weeks    PT Treatment/Interventions ADLs/Self Care Home Management;Aquatic Therapy;Moist Heat;Cryotherapy;Gait training;Stair training;Therapeutic exercise;Balance training;Functional mobility training;Therapeutic activities;Neuromuscular re-education;Taping;Dry  needling;Passive range of motion;Manual techniques;Patient/family education;Joint Manipulations;Spinal Manipulations    PT Next Visit Plan manual PRN; stabilization exercises    PT Home Exercise Plan pregnancy postural education handout/awareness    Consulted and Agree with Plan of Care Patient           Patient will benefit from skilled therapeutic intervention in order to improve the following deficits and impairments:  Abnormal gait, Decreased balance, Decreased endurance, Difficulty walking, Increased muscle spasms, Pain, Postural dysfunction, Improper body mechanics, Decreased strength, Decreased coordination, Decreased activity tolerance, Hypermobility  Visit Diagnosis: Coccyx pain  Abnormal posture  Other lack of coordination     Problem List Patient Active Problem List   Diagnosis Date Noted  . Indication for care in labor or delivery 03/28/2020  . Abdominal cramping complicating pregnancy 32/08/2481  . History of depression 01/20/2020  . Gastroesophageal reflux in pregnancy in second trimester 01/20/2020  . Anxiety 11/18/2019    Class: Chronic  . Depression 06/25/2019   Myles Gip PT, DPT [phone] 05/11/2020, 4:21 PM  [facility] [address] [place], [place], [address] Phone: [phone]   Fax:  [phone]  Name: Karen Hanna MRN: [id number] Date of Birth: [birth year]

## 2020-05-18 ENCOUNTER — Ambulatory Visit: Payer: BC Managed Care – PPO | Admitting: Physical Therapy

## 2020-05-18 ENCOUNTER — Encounter: Payer: Self-pay | Admitting: Physical Therapy

## 2020-05-18 ENCOUNTER — Other Ambulatory Visit: Payer: Self-pay

## 2020-05-18 DIAGNOSIS — M533 Sacrococcygeal disorders, not elsewhere classified: Secondary | ICD-10-CM

## 2020-05-18 DIAGNOSIS — R278 Other lack of coordination: Secondary | ICD-10-CM

## 2020-05-18 DIAGNOSIS — R293 Abnormal posture: Secondary | ICD-10-CM

## 2020-05-18 NOTE — Therapy (Signed)
[facility] [address] [place], [place], [address] Phone: [phone]   Fax:  [phone]  Physical Therapy Treatment  Patient Details  Name: Karen Hanna MRN: [id number] Date of Birth: 01-01-1991 Referring Provider (PT): Jeralyn Bennett   Encounter Date: 05/18/2020   PT End of Session - 05/18/20 0804    Visit Number 4    Number of Visits 8    Date for PT Re-Evaluation 06/22/20    Authorization Type IE 04/27/2020    PT Start Time 0800    PT Stop Time 0855    PT Time Calculation (min) 55 min    Activity Tolerance Patient tolerated treatment well    Behavior During Therapy [facility] for tasks assessed/performed           Past Medical History:  Diagnosis Date   Depression 06/25/2019    Past Surgical History:  Procedure Laterality Date   APPENDECTOMY      There were no vitals filed for this visit.   Subjective Assessment - 05/18/20 0803    Subjective Patient states that L hip and tailbone are feeling better. Patient notes that with manual from last session and follow-up exercises, she feels she is managing the pain well. Patient adds that now she feels stiffness in the front of B hips after prolonged sitting; she notes that it feels as though the front of the hips have to unlock.    Currently in Pain? Yes    Pain Score 4     Pain Location Sacrum    Pain Orientation Left    Pain Radiating Towards L hip           TREATMENT  Manual Therapy: STM and TPR performed to L gluteal complex and L QL to allow for decreased tension and pain and improved posture and function L innominate anterior mobilizations for decreased spasm and improved mobility, grade II/III   Neuromuscular Re-education: Sidelying, diaphragmatic breathing for nervous system downtraining and improved PFM coordination Physioball, seated pelvic mobilizations: anterior/posterior tilts, lateral tilts, clocks (CW/CCW) Seated TrA activation with coordinated breath for  improved force closure of pubic symphysis Standing Pilates Postural control for improved force closure of pubic symphysis: Serve a Tray Prep, GTB x5, BTB x8 Sidelying Hip flexor stretch, PT assisted for improved mobility and decreased lower crossed syndrome/posture  Patient educated throughout session on appropriate technique and form using multi-modal cueing, HEP, and activity modification. Patient articulated understanding and returned demonstration.  Patient Response to interventions: Reports 3/10 pain at end of session.  ASSESSMENT Patient presents to clinic with excellent motivation to participate in therapy. Patient demonstrates deficits in pain, posture, spinal mobility, PFM strength, PFM coordination. Patient able to activate TrA in standing postures with resistance for proprioceptive feedback during today's session and responded positively to educational and active interventions. Patient will benefit from continued skilled therapeutic intervention to address remaining deficits in pain, posture, spinal mobility, PFM strength, PFM coordination in order to increase function and improve overall QOL.     PT Long Term Goals - 04/27/20 1618      PT LONG TERM GOAL #1   Title Patient will demonstrate independence with HEP in order to maximize therapeutic gains and improve carryover from physical therapy sessions to ADLs in the home and community.    Baseline IE: not initiated    Time 8    Period Weeks    Status New    Target Date 06/22/20      PT LONG TERM  GOAL #2   Title Patient will demonstrate improved function as evidenced by a score of 64 on FOTO Urinary measure for full participation in activities at home and in the community.    Baseline IE: 55    Time 8    Period Weeks    Status New    Target Date 06/22/20      PT LONG TERM GOAL #3   Title Patient will demonstrate improved function as evidenced by a score of 30 on FOTO-PFDI Pain measure for full participation in activities at  home and in the community.    Baseline IE: 46    Time 8    Period Weeks    Status New    Target Date 06/22/20      PT LONG TERM GOAL #4   Title Patient will demonstrate proper body mechanics for improved stabilization of pelvic joints with transfers and lifts in order to manage pain and continue participation in activities at home and in the community.    Baseline IE: not demonstrated    Time 8    Period Weeks    Status New    Target Date 06/22/20      PT LONG TERM GOAL #5   Title Patient will decrease worst pain as reported on NPRS by at least 2 points to demonstrate clinically significant reduction in pain in order to restore/improve function and overall QOL.    Baseline IE: 9/10    Time 8    Period Weeks    Status New    Target Date 06/22/20                 Plan - 05/18/20 0804    Clinical Impression Statement Patient presents to clinic with excellent motivation to participate in therapy. Patient demonstrates deficits in pain, posture, spinal mobility, PFM strength, PFM coordination. Patient able to activate TrA in standing postures with resistance for proprioceptive feedback during today's session and responded positively to educational and active interventions. Patient will benefit from continued skilled therapeutic intervention to address remaining deficits in pain, posture, spinal mobility, PFM strength, PFM coordination in order to increase function and improve overall QOL.    Personal Factors and Comorbidities Age;Comorbidity 2    Comorbidities anxiety, depression    Examination-Activity Limitations Sit;Transfers;Bed Mobility;Squat;Lift;Bend;Stairs;Stand;Continence    Examination-Participation Restrictions Interpersonal Relationship;Yard Work;Cleaning;Laundry;Community Activity;Driving;Shop    Stability/Clinical Decision Making Evolving/Moderate complexity    Rehab Potential Fair    PT Frequency 1x / week    PT Duration 8 weeks    PT Treatment/Interventions  ADLs/Self Care Home Management;Aquatic Therapy;Moist Heat;Cryotherapy;Gait training;Stair training;Therapeutic exercise;Balance training;Functional mobility training;Therapeutic activities;Neuromuscular re-education;Taping;Dry needling;Passive range of motion;Manual techniques;Patient/family education;Joint Manipulations;Spinal Manipulations    PT Next Visit Plan manual PRN; stabilization exercises    PT Home Exercise Plan pregnancy postural education handout/awareness    Consulted and Agree with Plan of Care Patient           Patient will benefit from skilled therapeutic intervention in order to improve the following deficits and impairments:  Abnormal gait, Decreased balance, Decreased endurance, Difficulty walking, Increased muscle spasms, Pain, Postural dysfunction, Improper body mechanics, Decreased strength, Decreased coordination, Decreased activity tolerance, Hypermobility  Visit Diagnosis: Coccyx pain  Abnormal posture  Other lack of coordination     Problem List Patient Active Problem List   Diagnosis Date Noted   Indication for care in labor or delivery 03/28/2020   Abdominal cramping complicating pregnancy 03/28/2020   History of depression 01/20/2020   Gastroesophageal reflux  in pregnancy in second trimester 01/20/2020   Anxiety 11/18/2019    Class: Chronic   Depression 06/25/2019   Sheria Lang PT, DPT [phone]  05/18/2020, 11:13 AM  [facility] [address] [place], [place], [address] Phone: [phone]   Fax:  [phone]  Name: Karen Hanna MRN: [id number] Date of Birth: 1990/10/10

## 2020-05-23 ENCOUNTER — Ambulatory Visit (INDEPENDENT_AMBULATORY_CARE_PROVIDER_SITE_OTHER): Payer: BC Managed Care – PPO | Admitting: Certified Nurse Midwife

## 2020-05-23 ENCOUNTER — Other Ambulatory Visit: Payer: Self-pay

## 2020-05-23 VITALS — BP 93/54 | HR 81 | Wt 230.3 lb

## 2020-05-23 DIAGNOSIS — Z3A34 34 weeks gestation of pregnancy: Secondary | ICD-10-CM

## 2020-05-23 DIAGNOSIS — Z8659 Personal history of other mental and behavioral disorders: Secondary | ICD-10-CM

## 2020-05-23 DIAGNOSIS — Z3493 Encounter for supervision of normal pregnancy, unspecified, third trimester: Secondary | ICD-10-CM

## 2020-05-23 LAB — POCT URINALYSIS DIPSTICK OB
Bilirubin, UA: NEGATIVE
Blood, UA: NEGATIVE
Glucose, UA: NEGATIVE
Ketones, UA: NEGATIVE
Leukocytes, UA: NEGATIVE
Nitrite, UA: NEGATIVE
POC,PROTEIN,UA: NEGATIVE
Spec Grav, UA: 1.01 (ref 1.010–1.025)
Urobilinogen, UA: 0.2 E.U./dL
pH, UA: 5 (ref 5.0–8.0)

## 2020-05-23 NOTE — Progress Notes (Addendum)
ROB-Doing well, reports increased pelvic pressure and back pain. Discussed home treatment measures. Requests decreased hours at work; note given, see chart. Scheduled for waterbirth class on 09/01. Encouraged to submit birth plan via MyChart, verbalized understanding. 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 Anticipatory guidance regarding course of prenatal care. Reviewed red flag symptoms and when to call. RTC x 2 weeks for 36 week cultures and ROB or sooner if needed.

## 2020-05-23 NOTE — Addendum Note (Signed)
Addended by: Brooke Dare on: 05/23/2020 03:09 PM   Modules accepted: Orders

## 2020-05-23 NOTE — Patient Instructions (Addendum)
Fetal Movement Counts Patient Name: ________________________________________________ Patient Due Date: ____________________ What is a fetal movement count?  A fetal movement count is the number of times that you feel your baby move during a certain amount of time. This may also be called a fetal kick count. A fetal movement count is recommended for every pregnant woman. You may be asked to start counting fetal movements as early as week 28 of your pregnancy. Pay attention to when your baby is most active. You may notice your baby's sleep and wake cycles. You may also notice things that make your baby move more. You should do a fetal movement count:  When your baby is normally most active.  At the same time each day. A good time to count movements is while you are resting, after having something to eat and drink. How do I count fetal movements? 1. Find a quiet, comfortable area. Sit, or lie down on your side. 2. Write down the date, the start time and stop time, and the number of movements that you felt between those two times. Take this information with you to your health care visits. 3. Write down your start time when you feel the first movement. 4. Count kicks, flutters, swishes, rolls, and jabs. You should feel at least 10 movements. 5. You may stop counting after you have felt 10 movements, or if you have been counting for 2 hours. Write down the stop time. 6. If you do not feel 10 movements in 2 hours, contact your health care provider for further instructions. Your health care provider may want to do additional tests to assess your baby's well-being. Contact a health care provider if:  You feel fewer than 10 movements in 2 hours.  Your baby is not moving like he or she usually does. Date: ____________ Start time: ____________ Stop time: ____________ Movements: ____________ Date: ____________ Start time: ____________ Stop time: ____________ Movements: ____________ Date: ____________  Start time: ____________ Stop time: ____________ Movements: ____________ Date: ____________ Start time: ____________ Stop time: ____________ Movements: ____________ Date: ____________ Start time: ____________ Stop time: ____________ Movements: ____________ Date: ____________ Start time: ____________ Stop time: ____________ Movements: ____________ Date: ____________ Start time: ____________ Stop time: ____________ Movements: ____________ Date: ____________ Start time: ____________ Stop time: ____________ Movements: ____________ Date: ____________ Start time: ____________ Stop time: ____________ Movements: ____________ This information is not intended to replace advice given to you by your health care provider. Make sure you discuss any questions you have with your health care provider. Document Revised: 05/07/2019 Document Reviewed: 05/07/2019 Elsevier Patient Education  2020 [employer]  Group B Streptococcus Test During Pregnancy Why am I having this test? Routine testing, also called screening, for group B streptococcus (GBS) is recommended for all pregnant women between the 36th and 37th week of pregnancy. GBS is a type of bacteria that can be passed from mother to baby during childbirth. Screening will help guide whether or not you will need treatment during labor and delivery to prevent complications such as:  An infection in your uterus during labor.  An infection in your uterus after delivery.  A serious infection in your baby after delivery, such as pneumonia, meningitis, or sepsis. GBS screening is not often done before 36 weeks of pregnancy unless you go into labor prematurely. What happens if I have group B streptococcus? If testing shows that you have GBS, your health care provider will recommend treatment with IV antibiotics during labor and delivery. This treatment significantly decreases the risk of complications   for you and your baby. If you have a planned C-section and you  have GBS, you may not need to be treated with antibiotics because GBS is usually passed to babies after labor starts and your water breaks. If you are in labor or your water breaks before your C-section, it is possible for GBS to get into your uterus and be passed to your baby, so you might need treatment. Is there a chance I may not need to be tested? You may not need to be tested for GBS if:  You have a urine test that shows GBS before 36 to 37 weeks.  You had a baby with GBS infection after a previous delivery. In these cases, you will automatically be treated for GBS during labor and delivery. What is being tested? This test is done to check if you have group B streptococcus in your vagina or rectum. What kind of sample is taken? To collect samples for this test, your health care provider will swab your vagina and rectum with a cotton swab. The sample is then sent to the lab to see if GBS is present. What happens during the test?   You will remove your clothing from the waist down.  You will lie down on an exam table in the same position as you would for a pelvic exam.  Your health care provider will swab your vagina and rectum to collect samples for a culture test.  You will be able to go home after the test and do all your usual activities. How are the results reported? The test results are reported as positive or negative. What do the results mean?  A positive test means you are at risk for passing GBS to your baby during labor and delivery. Your health care provider will recommend that you are treated with an IV antibiotic during labor and delivery.  A negative test means you are at very low risk of passing GBS to your baby. There is still a low risk of passing GBS to your baby because sometimes test results may report that you do not have a condition when you do (false-negative result) or there is a chance that you may become infected with GBS after the test is done. You most  likely will not need to be treated with an antibiotic during labor and delivery. Talk with your health care provider about what your results mean. Questions to ask your health care provider Ask your health care provider, or the department that is doing the test:  When will my results be ready?  How will I get my results?  What are my treatment options? Summary  Routine testing (screening) for group B streptococcus (GBS) is recommended for all pregnant women between the 36th and 37th week of pregnancy.  GBS is a type of bacteria that can be passed from mother to baby during childbirth.  If testing shows that you have GBS, your health care provider will recommend that you are treated with IV antibiotics during labor and delivery. This treatment almost always prevents infection in newborns. This information is not intended to replace advice given to you by your health care provider. Make sure you discuss any questions you have with your health care provider. Document Revised: 01/08/2019 Document Reviewed: 10/15/2018 Elsevier Patient Education  2020 [employer].

## 2020-05-25 ENCOUNTER — Ambulatory Visit: Payer: BC Managed Care – PPO | Admitting: Physical Therapy

## 2020-06-01 ENCOUNTER — Encounter: Payer: Self-pay | Admitting: Physical Therapy

## 2020-06-01 ENCOUNTER — Other Ambulatory Visit: Payer: Self-pay

## 2020-06-01 ENCOUNTER — Ambulatory Visit: Payer: BC Managed Care – PPO | Attending: Certified Nurse Midwife | Admitting: Physical Therapy

## 2020-06-01 DIAGNOSIS — M533 Sacrococcygeal disorders, not elsewhere classified: Secondary | ICD-10-CM | POA: Diagnosis present

## 2020-06-01 DIAGNOSIS — R278 Other lack of coordination: Secondary | ICD-10-CM

## 2020-06-01 DIAGNOSIS — R293 Abnormal posture: Secondary | ICD-10-CM | POA: Insufficient documentation

## 2020-06-01 NOTE — Therapy (Signed)
[facility] [address] [place], [place], [address] Phone: [phone]   Fax:  [phone]  Physical Therapy Treatment  Patient Details  Name: Karen Hanna MRN: [id number] Date of Birth: [birth year] Referring Provider (PT): Jeralyn Bennett   Encounter Date: 06/01/2020   PT End of Session - 06/01/20 0806    Visit Number 5    Number of Visits 8    Date for PT Re-Evaluation 06/22/20    Authorization Type IE 04/27/2020    PT Start Time 0800    PT Stop Time 0845    PT Time Calculation (min) 45 min    Activity Tolerance Patient tolerated treatment well;Patient limited by fatigue;Patient limited by pain    Behavior During Therapy [facility] for tasks assessed/performed           Past Medical History:  Diagnosis Date  . Depression 06/25/2019    Past Surgical History:  Procedure Laterality Date  . APPENDECTOMY      There were no vitals filed for this visit.   Subjective Assessment - 06/01/20 0803    Subjective Patient reports that everything hurts. Patient notes that she feels the exercises are helpful but is still dealing with feeling "locked" inthe low back, hips, and pelvis if she stays in any position too long. Patient also notes that bed mobility is painful and difficult.    Currently in Pain? Yes    Pain Score 8     Pain Location Sacrum           TREATMENT  Manual Therapy: STM and TPR performed to B gluteal complex and B QL to allow for decreased tension and pain and improved posture and function L innominate mobilizations for decreased spasm and improved mobility, grade II/III L sacral border mobilizations for decreased pain and improved mobility, grade II/III  Neuromuscular Re-education: Supported child's pose, diaphragmatic breathing for nervous system downtraining and improved PFM coordination Supported child's pose pelvic mobilizations: anterior/posterior tilts, clocks (CW/CCW) Seated TrA activation with coordinated breath  for improved force closure of pubic symphysis Mini wall squat for improved postural awareness and positional control Glute sets for improved force closure of pelvic joints, 5 sec hold x 5 x 3  Patient educated throughout session on appropriate technique and form using multi-modal cueing, HEP, and activity modification. Patient articulated understanding and returned demonstration.  Patient Response to interventions: Reports 4/10 pain after manual and notes "It feels good now" at end of session.  ASSESSMENT Patient presents to clinic with excellent motivation to participate in therapy. Patient demonstrates deficits in pain, posture, spinal mobility, PFM strength, PFM coordination. Patient able to achieve significant pain reduction with manual and gentle isometric activities during today's session and responded positively to educational and active interventions. Patient will benefit from continued skilled therapeutic intervention to address remaining deficits in pain, posture, spinal mobility, PFM strength, PFM coordination in order to increase function and improve overall QOL.       PT Long Term Goals - 04/27/20 1618      PT LONG TERM GOAL #1   Title Patient will demonstrate independence with HEP in order to maximize therapeutic gains and improve carryover from physical therapy sessions to ADLs in the home and community.    Baseline IE: not initiated    Time 8    Period Weeks    Status New    Target Date 06/22/20      PT LONG TERM GOAL #2   Title Patient will demonstrate improved  function as evidenced by a score of 64 on FOTO Urinary measure for full participation in activities at home and in the community.    Baseline IE: 55    Time 8    Period Weeks    Status New    Target Date 06/22/20      PT LONG TERM GOAL #3   Title Patient will demonstrate improved function as evidenced by a score of 30 on FOTO-PFDI Pain measure for full participation in activities at home and in the  community.    Baseline IE: 46    Time 8    Period Weeks    Status New    Target Date 06/22/20      PT LONG TERM GOAL #4   Title Patient will demonstrate proper body mechanics for improved stabilization of pelvic joints with transfers and lifts in order to manage pain and continue participation in activities at home and in the community.    Baseline IE: not demonstrated    Time 8    Period Weeks    Status New    Target Date 06/22/20      PT LONG TERM GOAL #5   Title Patient will decrease worst pain as reported on NPRS by at least 2 points to demonstrate clinically significant reduction in pain in order to restore/improve function and overall QOL.    Baseline IE: 9/10    Time 8    Period Weeks    Status New    Target Date 06/22/20                 Plan - 06/01/20 0856    Clinical Impression Statement Patient presents to clinic with excellent motivation to participate in therapy. Patient demonstrates deficits in pain, posture, spinal mobility, PFM strength, PFM coordination. Patient able to achieve significant pain reduction with manual and gentle isometric activities during today's session and responded positively to educational and active interventions. Patient will benefit from continued skilled therapeutic intervention to address remaining deficits in pain, posture, spinal mobility, PFM strength, PFM coordination in order to increase function and improve overall QOL.    Personal Factors and Comorbidities Age;Comorbidity 2    Comorbidities anxiety, depression    Examination-Activity Limitations Sit;Transfers;Bed Mobility;Squat;Lift;Bend;Stairs;Stand;Continence    Examination-Participation Restrictions Interpersonal Relationship;Yard Work;Cleaning;Laundry;Community Activity;Driving;Shop    Stability/Clinical Decision Making Evolving/Moderate complexity    Rehab Potential Fair    PT Frequency 1x / week    PT Duration 8 weeks    PT Treatment/Interventions ADLs/Self Care Home  Management;Aquatic Therapy;Moist Heat;Cryotherapy;Gait training;Stair training;Therapeutic exercise;Balance training;Functional mobility training;Therapeutic activities;Neuromuscular re-education;Taping;Dry needling;Passive range of motion;Manual techniques;Patient/family education;Joint Manipulations;Spinal Manipulations    PT Next Visit Plan manual PRN; stabilization exercises    PT Home Exercise Plan pregnancy postural education handout/awareness    Consulted and Agree with Plan of Care Patient           Patient will benefit from skilled therapeutic intervention in order to improve the following deficits and impairments:  Abnormal gait, Decreased balance, Decreased endurance, Difficulty walking, Increased muscle spasms, Pain, Postural dysfunction, Improper body mechanics, Decreased strength, Decreased coordination, Decreased activity tolerance, Hypermobility  Visit Diagnosis: Coccyx pain  Other lack of coordination  Abnormal posture     Problem List Patient Active Problem List   Diagnosis Date Noted  . Indication for care in labor or delivery 03/28/2020  . Abdominal cramping complicating pregnancy 03/28/2020  . History of depression 01/20/2020  . Gastroesophageal reflux in pregnancy in second trimester 01/20/2020  . Anxiety  11/18/2019    Class: Chronic  . Depression 06/25/2019   Sheria Lang PT, DPT [phone]  06/01/2020, 9:19 AM  [facility] [address] [place], [place], [address] Phone: [phone]   Fax:  [phone]  Name: Karen Hanna MRN: [id number] Date of Birth: 1991-09-30

## 2020-06-07 ENCOUNTER — Other Ambulatory Visit: Payer: Self-pay

## 2020-06-07 ENCOUNTER — Other Ambulatory Visit: Payer: Self-pay | Admitting: Certified Nurse Midwife

## 2020-06-07 ENCOUNTER — Encounter: Payer: Self-pay | Admitting: Certified Nurse Midwife

## 2020-06-07 ENCOUNTER — Ambulatory Visit (INDEPENDENT_AMBULATORY_CARE_PROVIDER_SITE_OTHER): Payer: BC Managed Care – PPO | Admitting: Certified Nurse Midwife

## 2020-06-07 ENCOUNTER — Encounter: Payer: BC Managed Care – PPO | Admitting: Certified Nurse Midwife

## 2020-06-07 VITALS — BP 108/74 | HR 93 | Wt 233.8 lb

## 2020-06-07 DIAGNOSIS — Z3483 Encounter for supervision of other normal pregnancy, third trimester: Secondary | ICD-10-CM

## 2020-06-07 DIAGNOSIS — Z3A36 36 weeks gestation of pregnancy: Secondary | ICD-10-CM | POA: Diagnosis not present

## 2020-06-07 LAB — POCT URINALYSIS DIPSTICK OB
Bilirubin, UA: NEGATIVE
Blood, UA: NEGATIVE
Glucose, UA: NEGATIVE
Ketones, UA: NEGATIVE
Leukocytes, UA: NEGATIVE
Nitrite, UA: NEGATIVE
Spec Grav, UA: 1.015 (ref 1.010–1.025)
Urobilinogen, UA: 0.2 E.U./dL
pH, UA: 7 (ref 5.0–8.0)

## 2020-06-07 NOTE — Progress Notes (Signed)
ROB doing well. Feels good fetal movement. GBs and cultures collected today. SVE per pt request:unable to reach cervix. Reassurance give. Pt state she completed the class, she forgot to bring in certificate. She will bring it to next visit. Discussed labor precautions, herbal prep handout given. Follow up 1 wk with Marcelino Duster

## 2020-06-07 NOTE — Addendum Note (Signed)
Addended by: Blair Heys on: 06/07/2020 11:28 AM   Modules accepted: Orders

## 2020-06-07 NOTE — Patient Instructions (Signed)
Group B Streptococcus Infection During Pregnancy °Group B Streptococcus (GBS) is a type of bacteria that is often found in healthy people. It is commonly found in the rectum, vagina, and intestines. In people who are healthy and not pregnant, the bacteria rarely cause serious illness or complications. However, women who test positive for GBS during pregnancy can pass the bacteria to the baby during childbirth. This can cause serious infection in the baby after birth. °Women with GBS may also have infections during their pregnancy or soon after childbirth. The infections include urinary tract infections (UTIs) or infections of the uterus. GBS also increases a woman's risk of complications during pregnancy, such as early labor or delivery, miscarriage, or stillbirth. Routine testing for GBS is recommended for all pregnant women. °What are the causes? °This condition is caused by bacteria called Streptococcus agalactiae. °What increases the risk? °You may have a higher risk for GBS infection during pregnancy if you had one during a past pregnancy. °What are the signs or symptoms? °In most cases, GBS infection does not cause symptoms in pregnant women. If symptoms exist, they may include: °· Labor that starts before the 37th week of pregnancy. °· A UTI or bladder infection. This may cause a fever, frequent urination, or pain and burning during urination. °· Fever during labor. There can also be a rapid heartbeat in the mother or baby. °Rare but serious symptoms of a GBS infection in women include: °· Blood infection (septicemia). This may cause fever, chills, or confusion. °· Lung infection (pneumonia). This may cause fever, chills, cough, rapid breathing, chest pain, or difficulty breathing. °· Bone, joint, skin, or soft tissue infection. °How is this diagnosed? °You may be screened for GBS between week 35 and week 37 of pregnancy. If you have symptoms of preterm labor, you may be screened earlier. This condition is  diagnosed based on lab test results from: °· A swab of fluid from the vagina and rectum. °· A urine sample. °How is this treated? °This condition is treated with antibiotic medicine. Antibiotic medicine may be given: °· To you when you go into labor, or as soon as your water breaks. The medicines will continue until after you give birth. If you are having a cesarean delivery, you do not need antibiotics unless your water has broken. °· To your baby, if he or she requires treatment. Your health care provider will check your baby to decide if he or she needs antibiotics to prevent a serious infection. °Follow these instructions at home: °· Take over-the-counter and prescription medicines only as told by your health care provider. °· Take your antibiotic medicine as told by your health care provider. Do not stop taking the antibiotic even if you start to feel better. °· Keep all pre-birth (prenatal) visits and follow-up visits as told by your health care provider. This is important. °Contact a health care provider if: °· You have pain or burning when you urinate. °· You have to urinate more often than usual. °· You have a fever or chills. °· You develop a bad-smelling vaginal discharge. °Get help right away if: °· Your water breaks. °· You go into labor. °· You have severe pain in your abdomen. °· You have difficulty breathing. °· You have chest pain. °These symptoms may represent a serious problem that is an emergency. Do not wait to see if the symptoms will go away. Get medical help right away. Call your local emergency services (911 in the [place] Do not drive yourself to   the hospital. °Summary °· GBS is a type of bacteria that is common in healthy people. °· During pregnancy, colonization with GBS can cause serious complications for you or your baby. °· Your health care provider will screen you between 35 and 37 weeks of pregnancy to determine if you are colonized with GBS. °· If you are colonized with GBS during  pregnancy, your health care provider will recommend antibiotics through an IV during labor. °· After delivery, your baby will be evaluated for complications related to potential GBS infection and may require antibiotics to prevent a serious infection. °This information is not intended to replace advice given to you by your health care provider. Make sure you discuss any questions you have with your health care provider. °Document Revised: 04/13/2019 Document Reviewed: 04/13/2019 °Elsevier Patient Education © 2020 Elsevier Inc. ° °

## 2020-06-08 ENCOUNTER — Encounter: Payer: Self-pay | Admitting: Physical Therapy

## 2020-06-08 ENCOUNTER — Ambulatory Visit: Payer: BC Managed Care – PPO | Admitting: Physical Therapy

## 2020-06-09 LAB — STREP GP B NAA: Strep Gp B NAA: NEGATIVE

## 2020-06-10 LAB — GC/CHLAMYDIA PROBE AMP
Chlamydia trachomatis, NAA: NEGATIVE
Neisseria Gonorrhoeae by PCR: NEGATIVE

## 2020-06-17 ENCOUNTER — Other Ambulatory Visit: Payer: Self-pay

## 2020-06-17 ENCOUNTER — Encounter: Payer: Self-pay | Admitting: Certified Nurse Midwife

## 2020-06-17 ENCOUNTER — Ambulatory Visit (INDEPENDENT_AMBULATORY_CARE_PROVIDER_SITE_OTHER): Payer: BC Managed Care – PPO | Admitting: Certified Nurse Midwife

## 2020-06-17 VITALS — BP 122/62 | Wt 239.2 lb

## 2020-06-17 DIAGNOSIS — Z3483 Encounter for supervision of other normal pregnancy, third trimester: Secondary | ICD-10-CM | POA: Diagnosis not present

## 2020-06-17 DIAGNOSIS — Z3A37 37 weeks gestation of pregnancy: Secondary | ICD-10-CM

## 2020-06-17 LAB — POCT URINALYSIS DIPSTICK OB
Bilirubin, UA: NEGATIVE
Blood, UA: NEGATIVE
Glucose, UA: NEGATIVE
Ketones, UA: NEGATIVE
Leukocytes, UA: NEGATIVE
Nitrite, UA: NEGATIVE
Spec Grav, UA: 1.01 (ref 1.010–1.025)
Urobilinogen, UA: 0.2 E.U./dL
pH, UA: 7 (ref 5.0–8.0)

## 2020-06-17 NOTE — Progress Notes (Signed)
ROB-Reports pelvic pressure and intermittent upper abdominal tightening. Discussed home treatment measures. Birth plan reviewed and signed. Copy of waterbirth class certificate and quiz submitted. Pre-labor checklist, herbal prep guide, spinning babies three sisters of balance and birth affirmations given. Reviewed red flag symptoms and when to call. RTC x 1 week for ROB or sooner if needed.

## 2020-06-17 NOTE — Patient Instructions (Signed)
Vaginal Delivery  Vaginal delivery means that you give birth by pushing your baby out of your birth canal (vagina). A team of health care providers will help you before, during, and after vaginal delivery. Birth experiences are unique for every woman and every pregnancy, and birth experiences vary depending on where you choose to give birth. What happens when I arrive at the birth center or hospital? Once you are in labor and have been admitted into the hospital or birth center, your health care provider may:  Review your pregnancy history and any concerns that you have.  Insert an IV into one of your veins. This may be used to give you fluids and medicines.  Check your blood pressure, pulse, temperature, and heart rate (vital signs).  Check whether your bag of water (amniotic sac) has broken (ruptured).  Talk with you about your birth plan and discuss pain control options. Monitoring Your health care provider may monitor your contractions (uterine monitoring) and your baby's heart rate (fetal monitoring). You may need to be monitored:  Often, but not continuously (intermittently).  All the time or for long periods at a time (continuously). Continuous monitoring may be needed if: ? You are taking certain medicines, such as medicine to relieve pain or make your contractions stronger. ? You have pregnancy or labor complications. Monitoring may be done by:  Placing a special stethoscope or a handheld monitoring device on your abdomen to check your baby's heartbeat and to check for contractions.  Placing monitors on your abdomen (external monitors) to record your baby's heartbeat and the frequency and length of contractions.  Placing monitors inside your uterus through your vagina (internal monitors) to record your baby's heartbeat and the frequency, length, and strength of your contractions. Depending on the type of monitor, it may remain in your uterus or on your baby's head until  birth.  Telemetry. This is a type of continuous monitoring that can be done with external or internal monitors. Instead of having to stay in bed, you are able to move around during telemetry. Physical exam Your health care provider may perform frequent physical exams. This may include:  Checking how and where your baby is positioned in your uterus.  Checking your cervix to determine: ? Whether it is thinning out (effacing). ? Whether it is opening up (dilating). What happens during labor and delivery?  Normal labor and delivery is divided into the following three stages: Stage 1  This is the longest stage of labor.  This stage can last for hours or days.  Throughout this stage, you will feel contractions. Contractions generally feel mild, infrequent, and irregular at first. They get stronger, more frequent (about every 2-3 minutes), and more regular as you move through this stage.  This stage ends when your cervix is completely dilated to 4 inches (10 cm) and completely effaced. Stage 2  This stage starts once your cervix is completely effaced and dilated and lasts until the delivery of your baby.  This stage may last from 20 minutes to 2 hours.  This is the stage where you will feel an urge to push your baby out of your vagina.  You may feel stretching and burning pain, especially when the widest part of your baby's head passes through the vaginal opening (crowning).  Once your baby is delivered, the umbilical cord will be clamped and cut. This usually occurs after waiting a period of 1-2 minutes after delivery.  Your baby will be placed on your bare chest (  skin-to-skin contact) in an upright position and covered with a warm blanket. Watch your baby for feeding cues, like rooting or sucking, and help the baby to your breast for his or her first feeding. Stage 3  This stage starts immediately after the birth of your baby and ends after you deliver the placenta.  This stage may  take anywhere from 5 to 30 minutes.  After your baby has been delivered, you will feel contractions as your body expels the placenta and your uterus contracts to control bleeding. What can I expect after labor and delivery?  After labor is over, you and your baby will be monitored closely until you are ready to go home to ensure that you are both healthy. Your health care team will teach you how to care for yourself and your baby.  You and your baby will stay in the same room (rooming in) during your hospital stay. This will encourage early bonding and successful breastfeeding.  You may continue to receive fluids and medicines through an IV.  Your uterus will be checked and massaged regularly (fundal massage).  You will have some soreness and pain in your abdomen, vagina, and the area of skin between your vaginal opening and your anus (perineum).  If an incision was made near your vagina (episiotomy) or if you had some vaginal tearing during delivery, cold compresses may be placed on your episiotomy or your tear. This helps to reduce pain and swelling.  You may be given a squirt bottle to use instead of wiping when you go to the bathroom. To use the squirt bottle, follow these steps: ? Before you urinate, fill the squirt bottle with warm water. Do not use hot water. ? After you urinate, while you are sitting on the toilet, use the squirt bottle to rinse the area around your urethra and vaginal opening. This rinses away any urine and blood. ? Fill the squirt bottle with clean water every time you use the bathroom.  It is normal to have vaginal bleeding after delivery. Wear a sanitary pad for vaginal bleeding and discharge. Summary  Vaginal delivery means that you will give birth by pushing your baby out of your birth canal (vagina).  Your health care provider may monitor your contractions (uterine monitoring) and your baby's heart rate (fetal monitoring).  Your health care provider may  perform a physical exam.  Normal labor and delivery is divided into three stages.  After labor is over, you and your baby will be monitored closely until you are ready to go home. This information is not intended to replace advice given to you by your health care provider. Make sure you discuss any questions you have with your health care provider. Document Revised: 10/22/2017 Document Reviewed: 10/22/2017 Elsevier Patient Education  2020 Elsevier Inc.    Fetal Movement Counts Patient Name: ________________________________________________ Patient Due Date: ____________________ What is a fetal movement count?  A fetal movement count is the number of times that you feel your baby move during a certain amount of time. This may also be called a fetal kick count. A fetal movement count is recommended for every pregnant woman. You may be asked to start counting fetal movements as early as week 28 of your pregnancy. Pay attention to when your baby is most active. You may notice your baby's sleep and wake cycles. You may also notice things that make your baby move more. You should do a fetal movement count:  When your baby is normally most   active.  At the same time each day. A good time to count movements is while you are resting, after having something to eat and drink. How do I count fetal movements? 1. Find a quiet, comfortable area. Sit, or lie down on your side. 2. Write down the date, the start time and stop time, and the number of movements that you felt between those two times. Take this information with you to your health care visits. 3. Write down your start time when you feel the first movement. 4. Count kicks, flutters, swishes, rolls, and jabs. You should feel at least 10 movements. 5. You may stop counting after you have felt 10 movements, or if you have been counting for 2 hours. Write down the stop time. 6. If you do not feel 10 movements in 2 hours, contact your health care  provider for further instructions. Your health care provider may want to do additional tests to assess your baby's well-being. Contact a health care provider if:  You feel fewer than 10 movements in 2 hours.  Your baby is not moving like he or she usually does. Date: ____________ Start time: ____________ Stop time: ____________ Movements: ____________ Date: ____________ Start time: ____________ Stop time: ____________ Movements: ____________ Date: ____________ Start time: ____________ Stop time: ____________ Movements: ____________ Date: ____________ Start time: ____________ Stop time: ____________ Movements: ____________ Date: ____________ Start time: ____________ Stop time: ____________ Movements: ____________ Date: ____________ Start time: ____________ Stop time: ____________ Movements: ____________ Date: ____________ Start time: ____________ Stop time: ____________ Movements: ____________ Date: ____________ Start time: ____________ Stop time: ____________ Movements: ____________ Date: ____________ Start time: ____________ Stop time: ____________ Movements: ____________ This information is not intended to replace advice given to you by your health care provider. Make sure you discuss any questions you have with your health care provider. Document Revised: 05/07/2019 Document Reviewed: 05/07/2019 Elsevier Patient Education  2020 Elsevier Inc.  

## 2020-06-22 ENCOUNTER — Telehealth: Payer: Self-pay

## 2020-06-22 NOTE — Telephone Encounter (Signed)
Left voicemail informing pt FMLA paperwork has been completed. Asked pt to correspond via mychart.

## 2020-06-27 ENCOUNTER — Ambulatory Visit (INDEPENDENT_AMBULATORY_CARE_PROVIDER_SITE_OTHER): Payer: BC Managed Care – PPO | Admitting: Certified Nurse Midwife

## 2020-06-27 ENCOUNTER — Other Ambulatory Visit: Payer: Self-pay

## 2020-06-27 ENCOUNTER — Encounter: Payer: Self-pay | Admitting: Certified Nurse Midwife

## 2020-06-27 VITALS — BP 96/59 | HR 96 | Wt 240.2 lb

## 2020-06-27 DIAGNOSIS — Z3A39 39 weeks gestation of pregnancy: Secondary | ICD-10-CM

## 2020-06-27 LAB — POCT URINALYSIS DIPSTICK OB
Bilirubin, UA: NEGATIVE
Blood, UA: NEGATIVE
Glucose, UA: NEGATIVE
Ketones, UA: NEGATIVE
Leukocytes, UA: NEGATIVE
Nitrite, UA: NEGATIVE
POC,PROTEIN,UA: NEGATIVE
Spec Grav, UA: 1.01 (ref 1.010–1.025)
Urobilinogen, UA: 0.2 E.U./dL
pH, UA: 5 (ref 5.0–8.0)

## 2020-06-27 NOTE — Patient Instructions (Signed)
Braxton Hicks Contractions °Contractions of the uterus can occur throughout pregnancy, but they are not always a sign that you are in labor. You may have practice contractions called Braxton Hicks contractions. These false labor contractions are sometimes confused with true labor. °What are Braxton Hicks contractions? °Braxton Hicks contractions are tightening movements that occur in the muscles of the uterus before labor. Unlike true labor contractions, these contractions do not result in opening (dilation) and thinning of the cervix. Toward the end of pregnancy (32-34 weeks), Braxton Hicks contractions can happen more often and may become stronger. These contractions are sometimes difficult to tell apart from true labor because they can be very uncomfortable. You should not feel embarrassed if you go to the hospital with false labor. °Sometimes, the only way to tell if you are in true labor is for your health care provider to look for changes in the cervix. The health care provider will do a physical exam and may monitor your contractions. If you are not in true labor, the exam should show that your cervix is not dilating and your water has not broken. °If there are no other health problems associated with your pregnancy, it is completely safe for you to be sent home with false labor. You may continue to have Braxton Hicks contractions until you go into true labor. °How to tell the difference between true labor and false labor °True labor °· Contractions last 30-70 seconds. °· Contractions become very regular. °· Discomfort is usually felt in the top of the uterus, and it spreads to the lower abdomen and low back. °· Contractions do not go away with walking. °· Contractions usually become more intense and increase in frequency. °· The cervix dilates and gets thinner. °False labor °· Contractions are usually shorter and not as strong as true labor contractions. °· Contractions are usually irregular. °· Contractions  are often felt in the front of the lower abdomen and in the groin. °· Contractions may go away when you walk around or change positions while lying down. °· Contractions get weaker and are shorter-lasting as time goes on. °· The cervix usually does not dilate or become thin. °Follow these instructions at home: ° °· Take over-the-counter and prescription medicines only as told by your health care provider. °· Keep up with your usual exercises and follow other instructions from your health care provider. °· Eat and drink lightly if you think you are going into labor. °· If Braxton Hicks contractions are making you uncomfortable: °? Change your position from lying down or resting to walking, or change from walking to resting. °? Sit and rest in a tub of warm water. °? Drink enough fluid to keep your urine pale yellow. Dehydration may cause these contractions. °? Do slow and deep breathing several times an hour. °· Keep all follow-up prenatal visits as told by your health care provider. This is important. °Contact a health care provider if: °· You have a fever. °· You have continuous pain in your abdomen. °Get help right away if: °· Your contractions become stronger, more regular, and closer together. °· You have fluid leaking or gushing from your vagina. °· You pass blood-tinged mucus (bloody show). °· You have bleeding from your vagina. °· You have low back pain that you never had before. °· You feel your baby’s head pushing down and causing pelvic pressure. °· Your baby is not moving inside you as much as it used to. °Summary °· Contractions that occur before labor are   called Braxton Hicks contractions, false labor, or practice contractions. °· Braxton Hicks contractions are usually shorter, weaker, farther apart, and less regular than true labor contractions. True labor contractions usually become progressively stronger and regular, and they become more frequent. °· Manage discomfort from Braxton Hicks contractions  by changing position, resting in a warm bath, drinking plenty of water, or practicing deep breathing. °This information is not intended to replace advice given to you by your health care provider. Make sure you discuss any questions you have with your health care provider. °Document Revised: 08/30/2017 Document Reviewed: 01/31/2017 °Elsevier Patient Education © 2020 [employer]vier Inc[employer] ° °

## 2020-06-27 NOTE — Progress Notes (Signed)
ROB doing well. Feels good movement. Has some irregular ctx. Reviewed labor precautions . Discussed u/s next and recommendation induction at 41 wks.  visit she agrees. Follow up 1 wk with Marcelino Duster.   Doreene Burke, CNM

## 2020-07-04 ENCOUNTER — Inpatient Hospital Stay: Payer: BC Managed Care – PPO | Admitting: Anesthesiology

## 2020-07-04 ENCOUNTER — Encounter: Payer: Self-pay | Admitting: Obstetrics and Gynecology

## 2020-07-04 ENCOUNTER — Other Ambulatory Visit: Payer: Self-pay

## 2020-07-04 ENCOUNTER — Inpatient Hospital Stay
Admission: EM | Admit: 2020-07-04 | Discharge: 2020-07-06 | DRG: 807 | Disposition: A | Discharge status: Home / Self Care | Payer: BC Managed Care – PPO | Attending: Certified Nurse Midwife | Admitting: Certified Nurse Midwife

## 2020-07-04 DIAGNOSIS — Z8759 Personal history of other complications of pregnancy, childbirth and the puerperium: Secondary | ICD-10-CM | POA: Diagnosis present

## 2020-07-04 DIAGNOSIS — Z3A40 40 weeks gestation of pregnancy: Secondary | ICD-10-CM

## 2020-07-04 DIAGNOSIS — Z87891 Personal history of nicotine dependence: Secondary | ICD-10-CM

## 2020-07-04 DIAGNOSIS — O4292 Full-term premature rupture of membranes, unspecified as to length of time between rupture and onset of labor: Secondary | ICD-10-CM | POA: Diagnosis present

## 2020-07-04 DIAGNOSIS — Z20822 Contact with and (suspected) exposure to covid-19: Secondary | ICD-10-CM | POA: Diagnosis present

## 2020-07-04 DIAGNOSIS — O4202 Full-term premature rupture of membranes, onset of labor within 24 hours of rupture: Secondary | ICD-10-CM | POA: Diagnosis not present

## 2020-07-04 LAB — RAPID HIV SCREEN (HIV 1/2 AB+AG)
HIV 1/2 Antibodies: NONREACTIVE
HIV-1 P24 Antigen - HIV24: NONREACTIVE

## 2020-07-04 LAB — CBC
HCT: 38.9 % (ref 36.0–46.0)
Hemoglobin: 13.1 g/dL (ref 12.0–15.0)
MCH: 29.6 pg (ref 26.0–34.0)
MCHC: 33.7 g/dL (ref 30.0–36.0)
MCV: 88 fL (ref 80.0–100.0)
Platelets: 296 10*3/uL (ref 150–400)
RBC: 4.42 MIL/uL (ref 3.87–5.11)
RDW: 13.7 % (ref 11.5–15.5)
WBC: 12.1 10*3/uL — ABNORMAL HIGH (ref 4.0–10.5)
nRBC: 0 % (ref 0.0–0.2)

## 2020-07-04 LAB — RESPIRATORY PANEL BY RT PCR (FLU A&B, COVID)
Influenza A by PCR: NEGATIVE
Influenza B by PCR: NEGATIVE
SARS Coronavirus 2 by RT PCR: NEGATIVE

## 2020-07-04 LAB — TYPE AND SCREEN
ABO/RH(D): B POS
Antibody Screen: NEGATIVE

## 2020-07-04 MED ORDER — SOD CITRATE-CITRIC ACID 500-334 MG/5ML PO SOLN: 30.0000 mL | ORAL | Status: DC | PRN

## 2020-07-04 MED ORDER — LACTATED RINGERS IV SOLN: INTRAVENOUS | Status: DC

## 2020-07-04 MED ORDER — OXYTOCIN-SODIUM CHLORIDE 30-0.9 UT/500ML-% IV SOLN
INTRAVENOUS | Status: AC
  Filled 2020-07-04: qty 1000

## 2020-07-04 MED ORDER — LIDOCAINE HCL (PF) 1 % IJ SOLN
INTRAMUSCULAR | Status: AC
  Filled 2020-07-04: qty 30

## 2020-07-04 MED ORDER — MISOPROSTOL 200 MCG PO TABS
ORAL_TABLET | ORAL | Status: AC
  Filled 2020-07-04: qty 4

## 2020-07-04 MED ORDER — LACTATED RINGERS IV SOLN
500.0000 mL | Freq: Once | INTRAVENOUS | Status: AC
  Administered 2020-07-04: 500 mL via INTRAVENOUS

## 2020-07-04 MED ORDER — OXYTOCIN-SODIUM CHLORIDE 30-0.9 UT/500ML-% IV SOLN
1.0000 m[IU]/min | INTRAVENOUS | Status: DC
  Administered 2020-07-05: 2 m[IU]/min via INTRAVENOUS

## 2020-07-04 MED ORDER — SODIUM CHLORIDE 0.9 % IV SOLN
INTRAVENOUS | Status: DC | PRN
  Administered 2020-07-04 (×3): 5 mL via EPIDURAL

## 2020-07-04 MED ORDER — ZOLPIDEM TARTRATE 5 MG PO TABS: 5.0000 mg | ORAL_TABLET | Freq: Every evening | ORAL | Status: DC | PRN

## 2020-07-04 MED ORDER — PHENYLEPHRINE 40 MCG/ML (10ML) SYRINGE FOR IV PUSH (FOR BLOOD PRESSURE SUPPORT)
80.0000 ug | PREFILLED_SYRINGE | INTRAVENOUS | Status: DC | PRN
  Filled 2020-07-04: qty 10

## 2020-07-04 MED ORDER — OXYCODONE-ACETAMINOPHEN 5-325 MG PO TABS: 1.0000 | ORAL_TABLET | ORAL | Status: DC | PRN

## 2020-07-04 MED ORDER — SODIUM CHLORIDE 0.9% FLUSH: 3.0000 mL | INTRAVENOUS | Status: DC | PRN

## 2020-07-04 MED ORDER — OXYTOCIN BOLUS FROM INFUSION
333.0000 mL | Freq: Once | INTRAVENOUS | Status: AC
  Administered 2020-07-05: 333 mL via INTRAVENOUS

## 2020-07-04 MED ORDER — LIDOCAINE HCL (PF) 1 % IJ SOLN
INTRAMUSCULAR | Status: DC | PRN
  Administered 2020-07-04: 2 mL

## 2020-07-04 MED ORDER — ONDANSETRON HCL 4 MG/2ML IJ SOLN: 4.0000 mg | Freq: Four times a day (QID) | INTRAMUSCULAR | Status: DC | PRN

## 2020-07-04 MED ORDER — LIDOCAINE HCL (PF) 1 % IJ SOLN: 30.0000 mL | INTRAMUSCULAR | Status: DC | PRN

## 2020-07-04 MED ORDER — FENTANYL 2.5 MCG/ML W/ROPIVACAINE 0.15% IN NS 100 ML EPIDURAL (ARMC)
EPIDURAL | Status: AC
  Filled 2020-07-04: qty 100

## 2020-07-04 MED ORDER — SODIUM CHLORIDE 0.9 % IV SOLN: 250.0000 mL | INTRAVENOUS | Status: DC | PRN

## 2020-07-04 MED ORDER — TERBUTALINE SULFATE 1 MG/ML IJ SOLN: 0.2500 mg | Freq: Once | INTRAMUSCULAR | Status: DC | PRN

## 2020-07-04 MED ORDER — EPHEDRINE 5 MG/ML INJ
10.0000 mg | INTRAVENOUS | Status: DC | PRN
  Filled 2020-07-04: qty 2

## 2020-07-04 MED ORDER — AMMONIA AROMATIC IN INHA
RESPIRATORY_TRACT | Status: AC
  Filled 2020-07-04: qty 10

## 2020-07-04 MED ORDER — OXYCODONE-ACETAMINOPHEN 5-325 MG PO TABS: 2.0000 | ORAL_TABLET | ORAL | Status: DC | PRN

## 2020-07-04 MED ORDER — LACTATED RINGERS IV SOLN: 500.0000 mL | INTRAVENOUS | Status: DC | PRN

## 2020-07-04 MED ORDER — OXYTOCIN 10 UNIT/ML IJ SOLN
INTRAMUSCULAR | Status: AC
  Filled 2020-07-04: qty 2

## 2020-07-04 MED ORDER — FENTANYL 2.5 MCG/ML W/ROPIVACAINE 0.15% IN NS 100 ML EPIDURAL (ARMC): 12.0000 mL/h | EPIDURAL | Status: DC

## 2020-07-04 MED ORDER — OXYTOCIN-SODIUM CHLORIDE 30-0.9 UT/500ML-% IV SOLN
2.5000 [IU]/h | INTRAVENOUS | Status: DC
  Administered 2020-07-05: 2.5 [IU]/h via INTRAVENOUS

## 2020-07-04 MED ORDER — DIPHENHYDRAMINE HCL 50 MG/ML IJ SOLN
12.5000 mg | INTRAMUSCULAR | Status: DC | PRN
  Administered 2020-07-05: 12.5 mg via INTRAVENOUS
  Filled 2020-07-04: qty 1

## 2020-07-04 MED ORDER — ACETAMINOPHEN 325 MG PO TABS: 650.0000 mg | ORAL_TABLET | ORAL | Status: DC | PRN

## 2020-07-04 MED ORDER — FENTANYL 2.5 MCG/ML W/ROPIVACAINE 0.15% IN NS 100 ML EPIDURAL (ARMC)
EPIDURAL | Status: DC | PRN
Start: 2020-07-04 — End: 2020-07-05
  Administered 2020-07-04: 12 mL/h via EPIDURAL

## 2020-07-04 MED ORDER — SODIUM CHLORIDE 0.9% FLUSH: 3.0000 mL | Freq: Two times a day (BID) | INTRAVENOUS | Status: DC

## 2020-07-04 MED ORDER — BUTORPHANOL TARTRATE 1 MG/ML IJ SOLN: 1.0000 mg | INTRAMUSCULAR | Status: DC | PRN

## 2020-07-04 NOTE — H&P (Signed)
Obstetric History and Physical  ARION MORGAN is a 29 y.o. G2P0010 with IUP at [redacted]w[redacted]d presenting with full term premature rupture of membranes.   Patient states she has been having regular contractions, none vaginal bleeding, ruptured membranes-thick meconium fluid, with active fetal movement.    Denies difficulty breathing or respiratory distress, chest pain, dysuria, and leg pain.   Prenatal Course  Source of Care: [facility]-initial visit at 11 weeks, total visits: 11  Pregnancy complications or risks: History of depression-no medications, Rh positive  Prenatal labs and studies:  ABO, Rh: B/Positive/-- 09-Jan-2023 1435)  Antibody: Negative Jan 09, 2023 1435)  Rubella: 12.00 2023-01-09 1435)  RPR: Non Reactive (06/30 0841)   HBsAg: Negative 01/09/2023 1435)   HIV: pending  ZJQ:BHALPFXT/-- (09/07 1212)  1 hr Glucola: 113 (06/30 0841)  Genetic screening: Low risk female  Anatomy US: Complete, normal  Past Medical History:  Diagnosis Date   Depression 06/25/2019    Past Surgical History:  Procedure Laterality Date   APPENDECTOMY      OB History  Gravida Para Term Preterm AB Living  2       1    SAB TAB Ectopic Multiple Live Births               # Outcome Date GA Lbr Len/2nd Weight Sex Delivery Anes PTL Lv  2 Current           1 AB 2011            Social History   Socioeconomic History   Marital status: Married    Spouse name: Not on file   Number of children: Not on file   Years of education: Not on file   Highest education level: Not on file  Occupational History   Not on file  Tobacco Use   Smoking status: Former Smoker    Years: 11.00    Quit date: 01/28/2018    Years since quitting: 2.4   Smokeless tobacco: Never Used  Vaping Use   Vaping Use: Never used  Substance and Sexual Activity   Alcohol use: Not Currently    Alcohol/week: 1.0 standard drink    Types: 1 Glasses of wine per week   Drug use: Never   Sexual activity: Yes    Birth  control/protection: None  Other Topics Concern   Not on file  Social History Narrative   Not on file   Social Determinants of Health   Financial Resource Strain:    Difficulty of Paying Living Expenses: Not on file  Food Insecurity:    Worried About Programme researcher, broadcasting/film/video in the Last Year: Not on file   [employer] of Food in the Last Year: Not on file  Transportation Needs:    Lack of Transportation (Medical): Not on file   Lack of Transportation (Non-Medical): Not on file  Physical Activity:    Days of Exercise per Week: Not on file   Minutes of Exercise per Session: Not on file  Stress:    Feeling of Stress : Not on file  Social Connections:    Frequency of Communication with Friends and Family: Not on file   Frequency of Social Gatherings with Friends and Family: Not on file   Attends Religious Services: Not on file   Active Member of Clubs or Organizations: Not on file   Attends Banker Meetings: Not on file   Marital Status: Not on file    Family History  Problem Relation Age of Onset  Depression Mother    Hypertension Mother    Alcoholism Father    Mental illness Father    Hypertension Brother    Hypertension Maternal Grandfather    Stroke Maternal Grandfather     Medications Prior to Admission  Medication Sig Dispense Refill Last Dose   acetaminophen (TYLENOL) 325 MG tablet Take 650 mg by mouth every 6 (six) hours as needed.   Past Week at Unknown time   calcium carbonate (TUMS EX) 750 MG chewable tablet Chew 1 tablet by mouth daily.    07/03/2020 at Unknown time   Prenatal Vit-Fe Fumarate-FA (MULTIVITAMIN-PRENATAL) 27-0.8 MG TABS tablet Take 1 tablet by mouth daily at 12 noon.   07/03/2020 at Unknown time    No Known Allergies  Review of Systems: Negative except for what is mentioned in HPI.  Physical Exam:  Temp:  [97.2 F (36.2 C)] 97.2 F (36.2 C) (10/04 1203) Pulse Rate:  [89] 89 (10/04 1203) Resp:  [18] 18 (10/04  1203) BP: (131)/(84) 131/84 (10/04 1203) Weight:  [106.6 kg] 106.6 kg (10/04 1203)  GENERAL: Well-developed, well-nourished female in no acute distress.   LUNGS: Clear to auscultation bilaterally.   HEART: Regular rate and rhythm.  ABDOMEN: Soft, nontender, nondistended, gravid.  EXTREMITIES: Nontender, no edema, 2+ distal pulses.  Cervical Exam: Dilation: 3 Effacement (%): 60, 70 Cervical Position: Posterior Station: -2 Presentation: Vertex Exam by:: L Braddy RN  FHR Category I  Contractions: Every one (1) to four (4) minutes, soft resting tone   Pertinent Labs/Studies:   Results for orders placed or performed during the hospital encounter of 07/04/20 (from the past 24 hour(s))  CBC     Status: Abnormal   Collection Time: 07/04/20 12:24 PM  Result Value Ref Range   WBC 12.1 (H) 4.0 - 10.5 K/uL   RBC 4.42 3.87 - 5.11 MIL/uL   Hemoglobin 13.1 12.0 - 15.0 g/dL   HCT 41.9 36 - 46 %   MCV 88.0 80.0 - 100.0 fL   MCH 29.6 26.0 - 34.0 pg   MCHC 33.7 30.0 - 36.0 g/dL   RDW 62.2 29.7 - 98.9 %   Platelets 296 150 - 400 K/uL   nRBC 0.0 0.0 - 0.2 %    Assessment : CHEMIKA NIGHTENGALE is a 29 y.o. G2P0010 at [redacted]w[redacted]d being admitted for full term premature rupture of membranes, Rh positive, GBS negative  FHR Category I  Plan:  Admit to birthing suites, see orders.   Labor: Expectant management.    Induction/Augmentation as needed, per protocol.  Delivery plan: Hopeful for vaginal birth.   Dr. Valentino Saxon notified of admission and plan of care.    Gunnar Bulla, CNM [facility], [facility] 07/04/20 1:14 PM

## 2020-07-04 NOTE — Anesthesia Procedure Notes (Signed)
Epidural Patient location during procedure: OB Start time: 07/04/2020 8:44 PM End time: 07/04/2020 8:53 PM  Staffing Anesthesiologist: Karleen Hampshire, MD Performed: anesthesiologist   Preanesthetic Checklist Completed: patient identified, IV checked, site marked, risks and benefits discussed, surgical consent, monitors and equipment checked, pre-op evaluation and timeout performed  Epidural Patient position: sitting Prep: ChloraPrep Patient monitoring: heart rate, continuous pulse ox and blood pressure Approach: midline Location: L4-L5 Injection technique: LOR saline  Needle:  Needle type: Tuohy  Needle gauge: 18 G Needle length: 9 cm and 9 Needle insertion depth: 6 cm Catheter type: closed end flexible Catheter size: 20 Guage Catheter at skin depth: 11 cm Test dose: negative and Other  Assessment Events: blood not aspirated, injection not painful, no injection resistance, no paresthesia and negative IV test  Additional Notes Risks and benefits of procedure discussed with patient.  Risks including but not limited to infection, spinal/epidural hematoma, nerve injury, post dural puncture headache, and inadequate/failed block.  Patient expressed understanding and consented to epidural placement. Negative dural puncture.  Negative aspiration.  Negative paresthesia on injection.  Dose given in divided aliquots.  Patient tolerated the procedure well with no immediate complications.   Reason for block:procedure for pain

## 2020-07-04 NOTE — Progress Notes (Signed)
Patient ID: Karen Hanna, female   DOB: May 20, 1991, 29 y.o.   MRN: [id number]  Karen Hanna is a 29 y.o. G2P0010 at [redacted]w[redacted]d by ultrasound admitted for rupture of membranes  Subjective:  Patient sitting in bed, reporting intermittent pelvic pressure. FOB and mother at beside for continuous labor support.    Denies difficulty breathing or respiratory distress, chest pain, dysuria, and leg pain.   Objective:  Temp:  [97.2 F (36.2 C)-99.2 F (37.3 C)] 99.2 F (37.3 C) (10/04 1623) Pulse Rate:  [89-98] 98 (10/04 1627) Resp:  [18] 18 (10/04 1627) BP: (126-136)/(65-84) 136/65 (10/04 1627) Weight:  [106.6 kg] 106.6 kg (10/04 1203)  Fetal Wellbeing:  Category I  UC:   regular, every two (2) to five (5) minutes  SVE:  5/80/0, vertex  Labs: Lab Results  Component Value Date   WBC 12.1 (H) 07/04/2020   HGB 13.1 07/04/2020   HCT 38.9 07/04/2020   MCV 88.0 07/04/2020   PLT 296 07/04/2020    Assessment:  Karen Hanna is a 29 y.o. G2P0010 at [redacted]w[redacted]d admitted for full term premature rupture of membranes, Rh positive, GBS negative  FHR Category I  Plan:  Encouraged position change and use of non-pharmacologic pain management techniques.   Reviewed red flag symptoms and when to call.   Continue orders as written. Reassess as needed.    Karen Hanna  [facility], [facility] 07/04/2020, 5:50 PM

## 2020-07-04 NOTE — Progress Notes (Signed)
Patient ID: Karen Hanna, female   DOB: 08/27/1991, 29 y.o.   MRN: [id number]  Karen Hanna is a 29 y.o. G2P0010 at [redacted]w[redacted]d by ultrasound admitted for rupture of membranes  Subjective:  Patient leaning over bed, sitting on birthing ball and breathing through contractions. Reports pelvic pressure and the urge the push with each contraction.   FOB and mother present at bedside for continuous labor support.   Denies difficulty breathing or respiratory distress, chest pain, dysuria, and leg pain.   Objective:  Temp:  [97.2 F (36.2 C)-99.2 F (37.3 C)] 98.2 F (36.8 C) (10/04 1814) Pulse Rate:  [89-98] 98 (10/04 1627) Resp:  [18] 18 (10/04 1627) BP: (126-136)/(65-84) 136/65 (10/04 1627) Weight:  [106.6 kg] 106.6 kg (10/04 1203)  Fetal Wellbeing:  Category I  UC:   regular, every two (2) to four (4) minutes per patient report; soft resting tone  SVE:   Dilation: 7.5 Effacement (%): 90 Station: 0 Exam by:: M.Antero Derosia,CNM  Labs: Lab Results  Component Value Date   WBC 12.1 (H) 07/04/2020   HGB 13.1 07/04/2020   HCT 38.9 07/04/2020   MCV 88.0 07/04/2020   PLT 296 07/04/2020    Assessment:  Karen Hanna a 29 y.o.G2P0010 at [redacted]w[redacted]d admitted for full term premature rupture of membranes, Rh positive, GBS negative  FHR Category I  Plan:  Encouraged position change and use of other non-pharmacologic pain management options.   Reviewed red flag symptoms and when to call.   Continue orders as written. Reassess as needed.    Karen Hanna  [facility], [facility] 07/04/2020, 7:29 PM

## 2020-07-04 NOTE — Progress Notes (Signed)
2034 Pt requests epidural 2034 Dr Orland Penman notified 2038 MD at [facility] 2048 test dose  Maternal HR 105 2050 Bolus 1 given 2053 Bolus 2 Given 2056 Drip Started

## 2020-07-04 NOTE — Progress Notes (Signed)
M.Lawhorn, CNM order for intermittent monitoring and saline lock IV. Pt performing [employer] in room.

## 2020-07-04 NOTE — Progress Notes (Signed)
Pt presents to L&D for rupture of membrane and contractions. Pt is a 29 y/o G1P0 [redacted]w[redacted]d. Pt states she was at work and she started to leak fluid at 1130. Pt describes fluid as brown. SVE performed 3/40/-2 grossly ruptured heavy meconium, foul smelling, moderate amount. External monitors applied and assessing. VS stable temp 97.2. Initial FHT 140. M.Lawhorn, CNM aware of pt and orders placed and released. Orders for intermittent monitoring.

## 2020-07-04 NOTE — Anesthesia Preprocedure Evaluation (Signed)
Anesthesia Evaluation  Patient identified by MRN, date of birth, ID band Patient awake    Reviewed: Allergy & Precautions, H&P , NPO status , Patient's Chart, lab work & pertinent test results  Airway Mallampati: III  TM Distance: >3 FB     Dental  (+) Teeth Intact   Pulmonary neg pulmonary ROS, former smoker,           Cardiovascular Exercise Tolerance: Good (-) hypertensionnegative cardio ROS       Neuro/Psych PSYCHIATRIC DISORDERS Anxiety Depression    GI/Hepatic negative GI ROS,   Endo/Other    Renal/GU   negative genitourinary   Musculoskeletal   Abdominal   Peds  Hematology negative hematology ROS (+)   Anesthesia Other Findings Obese  Past Medical History: 06/25/2019: Depression  Past Surgical History: No date: APPENDECTOMY  BMI    Body Mass Index: 39.11 kg/m      Reproductive/Obstetrics (+) Pregnancy                             Anesthesia Physical Anesthesia Plan  ASA: II  Anesthesia Plan: Epidural   Post-op Pain Management:    Induction:   PONV Risk Score and Plan:   Airway Management Planned:   Additional Equipment:   Intra-op Plan:   Post-operative Plan:   Informed Consent: I have reviewed the patients History and Physical, chart, labs and discussed the procedure including the risks, benefits and alternatives for the proposed anesthesia with the patient or authorized representative who has indicated his/her understanding and acceptance.       Plan Discussed with: Anesthesiologist  Anesthesia Plan Comments:         Anesthesia Quick Evaluation

## 2020-07-05 ENCOUNTER — Encounter: Payer: Self-pay | Admitting: Certified Nurse Midwife

## 2020-07-05 DIAGNOSIS — O4202 Full-term premature rupture of membranes, onset of labor within 24 hours of rupture: Secondary | ICD-10-CM

## 2020-07-05 DIAGNOSIS — Z3A40 40 weeks gestation of pregnancy: Secondary | ICD-10-CM

## 2020-07-05 LAB — CBC
HCT: 31.1 % — ABNORMAL LOW (ref 36.0–46.0)
Hemoglobin: 11.1 g/dL — ABNORMAL LOW (ref 12.0–15.0)
MCH: 31.9 pg (ref 26.0–34.0)
MCHC: 35.7 g/dL (ref 30.0–36.0)
MCV: 89.4 fL (ref 80.0–100.0)
Platelets: 237 10*3/uL (ref 150–400)
RBC: 3.48 MIL/uL — ABNORMAL LOW (ref 3.87–5.11)
RDW: 13.9 % (ref 11.5–15.5)
WBC: 18.3 10*3/uL — ABNORMAL HIGH (ref 4.0–10.5)
nRBC: 0 % (ref 0.0–0.2)

## 2020-07-05 LAB — ABO/RH: ABO/RH(D): B POS

## 2020-07-05 LAB — RPR: RPR Ser Ql: NONREACTIVE

## 2020-07-05 MED ORDER — SIMETHICONE 80 MG PO CHEW: 80.0000 mg | CHEWABLE_TABLET | ORAL | Status: DC | PRN

## 2020-07-05 MED ORDER — SENNOSIDES-DOCUSATE SODIUM 8.6-50 MG PO TABS: 2.0000 | ORAL_TABLET | ORAL | Status: DC

## 2020-07-05 MED ORDER — ONDANSETRON HCL 4 MG/2ML IJ SOLN: 4.0000 mg | INTRAMUSCULAR | Status: DC | PRN

## 2020-07-05 MED ORDER — METHYLERGONOVINE MALEATE 0.2 MG/ML IJ SOLN: 0.2000 mg | INTRAMUSCULAR | Status: DC | PRN

## 2020-07-05 MED ORDER — IBUPROFEN 600 MG PO TABS
600.0000 mg | ORAL_TABLET | Freq: Four times a day (QID) | ORAL | Status: DC
  Administered 2020-07-05 – 2020-07-06 (×5): 600 mg via ORAL
  Filled 2020-07-05 (×5): qty 1

## 2020-07-05 MED ORDER — SODIUM CHLORIDE 0.9 % IV SOLN: 250.0000 mL | INTRAVENOUS | Status: DC | PRN

## 2020-07-05 MED ORDER — PRENATAL MULTIVITAMIN CH
1.0000 | ORAL_TABLET | Freq: Every day | ORAL | Status: DC
  Administered 2020-07-05: 1 via ORAL
  Filled 2020-07-05: qty 1

## 2020-07-05 MED ORDER — SODIUM CHLORIDE 0.9% FLUSH: 3.0000 mL | INTRAVENOUS | Status: DC | PRN

## 2020-07-05 MED ORDER — SODIUM CHLORIDE 0.9% FLUSH
3.0000 mL | Freq: Two times a day (BID) | INTRAVENOUS | Status: DC
  Administered 2020-07-05: 3 mL via INTRAVENOUS

## 2020-07-05 MED ORDER — METHYLERGONOVINE MALEATE 0.2 MG PO TABS: 0.2000 mg | ORAL_TABLET | ORAL | Status: DC | PRN

## 2020-07-05 MED ORDER — DIBUCAINE (PERIANAL) 1 % EX OINT
1.0000 "application " | TOPICAL_OINTMENT | CUTANEOUS | Status: DC | PRN
  Administered 2020-07-05: 1 via RECTAL
  Filled 2020-07-05: qty 28

## 2020-07-05 MED ORDER — COCONUT OIL OIL
1.0000 "application " | TOPICAL_OIL | Status: DC | PRN
  Filled 2020-07-05: qty 120

## 2020-07-05 MED ORDER — BENZOCAINE-MENTHOL 20-0.5 % EX AERO
1.0000 "application " | INHALATION_SPRAY | CUTANEOUS | Status: DC | PRN
  Administered 2020-07-06: 1 via TOPICAL
  Filled 2020-07-05: qty 56

## 2020-07-05 MED ORDER — ONDANSETRON HCL 4 MG PO TABS: 4.0000 mg | ORAL_TABLET | ORAL | Status: DC | PRN

## 2020-07-05 MED ORDER — WITCH HAZEL-GLYCERIN EX PADS
1.0000 "application " | MEDICATED_PAD | CUTANEOUS | Status: DC | PRN
  Administered 2020-07-05: 1 via TOPICAL
  Filled 2020-07-05: qty 100

## 2020-07-05 MED ORDER — ACETAMINOPHEN 325 MG PO TABS
650.0000 mg | ORAL_TABLET | ORAL | Status: DC | PRN
  Administered 2020-07-05: 650 mg via ORAL
  Filled 2020-07-05: qty 2

## 2020-07-05 MED ORDER — FERROUS SULFATE 325 (65 FE) MG PO TABS
325.0000 mg | ORAL_TABLET | Freq: Two times a day (BID) | ORAL | Status: DC
  Administered 2020-07-05 – 2020-07-06 (×3): 325 mg via ORAL
  Filled 2020-07-05 (×3): qty 1

## 2020-07-05 MED ORDER — DIPHENHYDRAMINE HCL 25 MG PO CAPS: 25.0000 mg | ORAL_CAPSULE | Freq: Four times a day (QID) | ORAL | Status: DC | PRN

## 2020-07-05 MED ORDER — BENZOCAINE-MENTHOL 20-0.5 % EX AERO
INHALATION_SPRAY | CUTANEOUS | Status: AC
  Administered 2020-07-05: 1 via TOPICAL
  Filled 2020-07-05: qty 56

## 2020-07-05 MED ORDER — SENNOSIDES-DOCUSATE SODIUM 8.6-50 MG PO TABS
2.0000 | ORAL_TABLET | ORAL | Status: DC
  Administered 2020-07-05: 2 via ORAL
  Filled 2020-07-05: qty 2

## 2020-07-05 NOTE — Progress Notes (Signed)
Patient ID: Karen Hanna, female   DOB: 1990/10/23, 29 y.o.   MRN: [id number]  Karen Hanna is a 29 y.o. G2P0010 at [redacted]w[redacted]d by ultrasound admitted for rupture of membranes  Subjective:  Patient resting in bed, on cell phone. Reports relief of pain since epidural placement.   FOB and mother at bedside for continuous labor support.   Denies difficulty breathing or respiratory distress, chest pain, abdominal pain, dysuria, and leg pain.   Objective:  Temp:  [97.2 F (36.2 C)-99.2 F (37.3 C)] 98.3 F (36.8 C) (10/04 2241) Pulse Rate:  [85-115] 87 (10/04 2241) Resp:  [18-22] 18 (10/04 2241) BP: (95-138)/(48-88) 95/48 (10/04 2241) SpO2:  [95 %-99 %] 95 % (10/04 2241) Weight:  [106.6 kg] 106.6 kg (10/04 1203)  Fetal Wellbeing:  Category I   UC:   regular, every two (2) to six (6) minutes; soft resting tone  SVE:   Dilation: 9 Effacement (%): 90 Station: 0 Exam by:: S.Apel  Labs: Lab Results  Component Value Date   WBC 12.1 (H) 07/04/2020   HGB 13.1 07/04/2020   HCT 38.9 07/04/2020   MCV 88.0 07/04/2020   PLT 296 07/04/2020    Assessment:  Karen Hanna a 29 y.o.G2P0010 at [redacted]w[redacted]d admitted forfull term premature rupture of membranes, Rh positive, GBS negative  FHR Category I  Plan:  Reviewed red flag symptoms and when to call.   Room prepared for second stage.   Continue orders as written. Reassess as needed.    Serafina Royals  [facility], [facility] 07/05/2020, 12:18 AM

## 2020-07-05 NOTE — Lactation Note (Signed)
This note was copied from a baby's chart. Lactation Consultation Note  Patient Name: Karen Hanna IRCVE'L Date: 07/05/2020 Reason for consult: Initial assessment;1st time breastfeeding;Term  Initial lactation visit. Mom is G2P1 with no prior BF experience. SVD 7hrs ago, both mom and baby resting comfortably. LC and LC student reviewed BF basics: normal newborn feeding patterns, stomach size, feeding behaviors, early cues, and output expectations.  Encouraged mom to rest as baby rests, reviewed benefits of skin to skin in acclimating to new life.  LC name/number updated on whiteboard, encouraged to call out at next feeding.  Maternal Data Formula Feeding for Exclusion: No Has patient been taught Hand Expression?: Yes Does the patient have breastfeeding experience prior to this delivery?: No  Feeding Feeding Type: Breast Fed  LATCH Score                   Interventions Interventions: Breast feeding basics reviewed  Lactation Tools Discussed/Used     Consult Status Consult Status: Follow-up Date: 07/05/20 Follow-up type: In-patient    [name] 07/05/2020, 10:50 AM

## 2020-07-06 ENCOUNTER — Other Ambulatory Visit: Payer: BC Managed Care – PPO

## 2020-07-06 ENCOUNTER — Encounter: Payer: BC Managed Care – PPO | Admitting: Certified Nurse Midwife

## 2020-07-06 LAB — CBC
HCT: 28.1 % — ABNORMAL LOW (ref 36.0–46.0)
Hemoglobin: 9.8 g/dL — ABNORMAL LOW (ref 12.0–15.0)
MCH: 30.3 pg (ref 26.0–34.0)
MCHC: 34.9 g/dL (ref 30.0–36.0)
MCV: 87 fL (ref 80.0–100.0)
Platelets: 198 10*3/uL (ref 150–400)
RBC: 3.23 MIL/uL — ABNORMAL LOW (ref 3.87–5.11)
RDW: 13.9 % (ref 11.5–15.5)
WBC: 12.3 10*3/uL — ABNORMAL HIGH (ref 4.0–10.5)
nRBC: 0 % (ref 0.0–0.2)

## 2020-07-06 LAB — SURGICAL PATHOLOGY

## 2020-07-06 MED ORDER — FERROUS SULFATE 325 (65 FE) MG PO TABS
325.0000 mg | ORAL_TABLET | Freq: Two times a day (BID) | ORAL | 3 refills | Status: DC
Start: 2020-07-06 — End: 2020-11-29

## 2020-07-06 NOTE — Discharge Summary (Signed)
Patient Name: Karen Hanna DOB: [birth year] MRN: [id number]                            Discharge Summary  Date of Admission: 07/04/2020 Date of Discharge: 07/06/2020 Delivering Provider: Holly Bodily MICHELLE   Admitting Diagnosis: Full-term premature rupture of membranes [O42.92] at [redacted]w[redacted]d Secondary diagnosis:  Active Problems:   Full-term premature rupture of membranes   [redacted] weeks gestation of pregnancy   Vaginal delivery  Mode of Delivery: normal spontaneous vaginal delivery              Discharge diagnosis: Term Pregnancy Delivered      Intrapartum Procedures: epidural   Post partum procedures: none  Complications: Bilateral periurethral, sulcus and second degree perineal lacerations repaired with 3-0 vicryl rapide                     Discharge Day SOAP Note:  Progress Note - Vaginal Delivery  Karen Hanna is a 29 y.o. G2P1011 now PP day 1 s/p Vaginal, Spontaneous . Delivery was uncomplicated  Subjective  The patient has the following complaints: has no unusual complaints  Pain is controlled with current medications.   Patient is urinating without difficulty.  She is ambulating well.     Objective  Vital signs: BP 108/75   Pulse 73   Temp 98.3 F (36.8 C) (Oral)   Resp 18   Ht 5\' 5"  (1.651 m)   Wt 106.6 kg   LMP 09/22/2019   SpO2 98%   Breastfeeding Unknown   BMI 39.11 kg/m   Physical Exam: Gen: NAD Fundus Fundal Tone: Firm  Lochia Amount: Small        Data Review Labs: Lab Results  Component Value Date   WBC 12.3 (H) 07/06/2020   HGB 9.8 (L) 07/06/2020   HCT 28.1 (L) 07/06/2020   MCV 87.0 07/06/2020   PLT 198 07/06/2020   CBC Latest Ref Rng & Units 07/06/2020 07/05/2020 07/04/2020  WBC 4.0 - 10.5 K/uL 12.3(H) 18.3(H) 12.1(H)  Hemoglobin 12.0 - 15.0 g/dL 09/03/2020) 11.1(L) 13.1  Hematocrit 36 - 46 % 28.1(L) 31.1(L) 38.9  Platelets 150 - 400 K/uL 198 237 296   B POS Performed at [facility], [address] [place],  [place]   [id number] Score: Inocente Salles Postnatal Depression Scale Screening Tool 07/05/2020  I have been able to laugh and see the funny side of things. 0  I have looked forward with enjoyment to things. 1  I have blamed myself unnecessarily when things went wrong. 2  I have been anxious or worried for no good reason. 3  I have felt scared or panicky for no good reason. 2  Things have been getting on top of me. 1  I have been so unhappy that I have had difficulty sleeping. 1  I have felt sad or miserable. 1  I have been so unhappy that I have been crying. 1  The thought of harming myself has occurred to me. 0  Edinburgh Postnatal Depression Scale Total 12    Assessment/Plan  Active Problems:   Full-term premature rupture of membranes   [redacted] weeks gestation of pregnancy   Vaginal delivery    Plan for discharge today.  Discharge Instructions: Per After Visit Summary. Activity: Advance as tolerated. Pelvic rest for 6 weeks.  Also refer to After Visit Summary Diet: Regular Medications: Allergies as of 07/06/2020  No Known Allergies     Medication List    TAKE these medications   calcium carbonate 750 MG chewable tablet Commonly known as: TUMS EX Chew 1 tablet by mouth daily.   ferrous sulfate 325 (65 FE) MG tablet Take 1 tablet (325 mg total) by mouth 2 (two) times daily with a meal.   multivitamin-prenatal 27-0.8 MG Tabs tablet Take 1 tablet by mouth daily at 12 noon.   Tylenol 325 MG tablet Generic drug: acetaminophen Take 650 mg by mouth every 6 (six) hours as needed.      Outpatient follow up: 2 wk tele visit and 6 week ppv with Serafina Royals, CNM  Postpartum contraception: undecided, will discuss at 6 week visit.   Discharged Condition: good  Discharged to: home  Newborn Data: Disposition:home with mother  Apgars: APGAR (1 MIN): 8   APGAR (5 MINS): 9   APGAR (10 MINS):    Baby Feeding: Breast    Doreene Burke, CNM  07/06/2020 7:51 AM

## 2020-07-06 NOTE — Progress Notes (Signed)
Mother discharged.  Discharge instructions given.  Mother verbalizes understanding.  Transported by auxiliary.  

## 2020-07-06 NOTE — Final Progress Note (Signed)
Discharge Day SOAP Note:  Progress Note - Vaginal Delivery  Karen Hanna is a 29 y.o. G2P1011 now PP day 1 s/p Vaginal, Spontaneous . Delivery was uncomplicated  Subjective  The patient has the following complaints: has no unusual complaints  Pain is controlled with current medications.   Patient is urinating without difficulty.  She is ambulating well.     Objective  Vital signs: BP 108/75   Pulse 73   Temp 98.3 F (36.8 C) (Oral)   Resp 18   Ht 5\' 5"  (1.651 m)   Wt 106.6 kg   LMP 09/22/2019   SpO2 98%   Breastfeeding Unknown   BMI 39.11 kg/m   Physical Exam: Gen: NAD Fundus Fundal Tone: Firm  Lochia Amount: Small        Data Review Labs: Lab Results  Component Value Date   WBC 12.3 (H) 07/06/2020   HGB 9.8 (L) 07/06/2020   HCT 28.1 (L) 07/06/2020   MCV 87.0 07/06/2020   PLT 198 07/06/2020   CBC Latest Ref Rng & Units 07/06/2020 07/05/2020 07/04/2020  WBC 4.0 - 10.5 K/uL 12.3(H) 18.3(H) 12.1(H)  Hemoglobin 12.0 - 15.0 g/dL 09/03/2020) 11.1(L) 13.1  Hematocrit 36 - 46 % 28.1(L) 31.1(L) 38.9  Platelets 150 - 400 K/uL 198 237 296   B POS Performed at [facility], [address] [place], [place]   [id number] Score: Inocente Salles Postnatal Depression Scale Screening Tool 07/05/2020  I have been able to laugh and see the funny side of things. 0  I have looked forward with enjoyment to things. 1  I have blamed myself unnecessarily when things went wrong. 2  I have been anxious or worried for no good reason. 3  I have felt scared or panicky for no good reason. 2  Things have been getting on top of me. 1  I have been so unhappy that I have had difficulty sleeping. 1  I have felt sad or miserable. 1  I have been so unhappy that I have been crying. 1  The thought of harming myself has occurred to me. 0  Edinburgh Postnatal Depression Scale Total 12    Assessment/Plan  Active Problems:   Full-term premature rupture of membranes   [redacted] weeks  gestation of pregnancy   Vaginal delivery    Plan for discharge today.  Discharge Instructions: Per After Visit Summary. Activity: Advance as tolerated. Pelvic rest for 6 weeks.  Also refer to After Visit Summary Diet: Regular Medications: Allergies as of 07/06/2020   No Known Allergies     Medication List    TAKE these medications   calcium carbonate 750 MG chewable tablet Commonly known as: TUMS EX Chew 1 tablet by mouth daily.   ferrous sulfate 325 (65 FE) MG tablet Take 1 tablet (325 mg total) by mouth 2 (two) times daily with a meal.   multivitamin-prenatal 27-0.8 MG Tabs tablet Take 1 tablet by mouth daily at 12 noon.   Tylenol 325 MG tablet Generic drug: acetaminophen Take 650 mg by mouth every 6 (six) hours as needed.      Outpatient follow up: 2 wk tele visit and 6 week ppv with 09/05/2020, CNM  Postpartum contraception: undecided, will discuss at 6 week visit.   Discharged Condition: good  Discharged to: home  Newborn Data: Disposition:home with mother  Apgars: APGAR (1 MIN): 8   APGAR (5 MINS): 9   APGAR (10 MINS):    Baby Feeding: Breast  Doreene Burke, CNM  07/06/2020 7:51 AM

## 2020-07-06 NOTE — Lactation Note (Signed)
This note was copied from a baby's chart. Lactation Consultation Note  Patient Name: Karen Hanna [id number] Date: 07/06/2020 Reason for consult: Follow-up assessment;1st time breastfeeding;Term  Lactation follow-up before discharge. Feedings going well per mom, no pain or discomfort; mom noting difference in deep and shallow latch, coconut oil used for soreness. Baby active at breast, audible swallows heard, and output above expectations for HOL. LC reviewed BF basics and what to expect in the days to come. Encouraged to track output. Reviewed newborn feeding patterns, growth spurts/cluster feeding, feeding cues, breast fullness and engorgement. Parents had questions about pump: reviewed pump introduction around 4 weeks, milk supply and demand and normal course of lactation. Parents had no additional questions. Information for outpatient lactation services and community breastfeeding support given. Encouraged to call out today before discharge if needed.  Maternal Data Formula Feeding for Exclusion: No Has patient been taught Hand Expression?: Yes Does the patient have breastfeeding experience prior to this delivery?: No  Feeding Feeding Type: Breast Fed  LATCH Score Latch: Grasps breast easily, tongue down, lips flanged, rhythmical sucking.  Audible Swallowing: Spontaneous and intermittent  Type of Nipple: Everted at rest and after stimulation  Comfort (Breast/Nipple): Soft / non-tender  Hold (Positioning): Assistance needed to correctly position infant at breast and maintain latch.  LATCH Score: 9  Interventions Interventions: Breast feeding basics reviewed  Lactation Tools Discussed/Used     Consult Status Consult Status: Complete Date: 07/06/20 Follow-up type: Call as needed    [name] 07/06/2020, 11:07 AM

## 2020-07-06 NOTE — Anesthesia Postprocedure Evaluation (Signed)
Anesthesia Post Note  Patient: Karen Hanna  Procedure(s) Performed: AN AD HOC LABOR EPIDURAL  Patient location during evaluation: Mother Baby Anesthesia Type: Epidural Level of consciousness: awake and alert and oriented Pain management: pain level controlled Vital Signs Assessment: post-procedure vital signs reviewed and stable Respiratory status: respiratory function stable Cardiovascular status: stable Postop Assessment: no headache, no backache, patient able to bend at knees, no apparent nausea or vomiting, able to ambulate and adequate PO intake Anesthetic complications: no   No complications documented.   Last Vitals:  Vitals:   07/06/20 0303 07/06/20 0802  BP: 108/75 95/62  Pulse: 73 75  Resp: 18 18  Temp: 36.8 C 36.6 C  SpO2: 98% 98%    Last Pain:  Vitals:   07/06/20 0802  TempSrc: Oral  PainSc:                  Zachary George

## 2020-07-18 ENCOUNTER — Other Ambulatory Visit: Payer: Self-pay

## 2020-07-18 ENCOUNTER — Encounter: Payer: Self-pay | Admitting: Certified Nurse Midwife

## 2020-07-18 ENCOUNTER — Ambulatory Visit (INDEPENDENT_AMBULATORY_CARE_PROVIDER_SITE_OTHER): Payer: BC Managed Care – PPO | Admitting: Certified Nurse Midwife

## 2020-07-18 DIAGNOSIS — O99345 Other mental disorders complicating the puerperium: Secondary | ICD-10-CM

## 2020-07-18 DIAGNOSIS — F418 Other specified anxiety disorders: Secondary | ICD-10-CM

## 2020-07-18 DIAGNOSIS — Z1331 Encounter for screening for depression: Secondary | ICD-10-CM

## 2020-07-18 NOTE — Patient Instructions (Addendum)
Postpartum Baby Blues The postpartum period begins right after the birth of a baby. During this time, there is often a lot of joy and excitement. It is also a time of many changes in the life of the parents. No matter how many times a mother gives birth, each child brings new challenges to the family, including different ways of relating to one another. It is common to have feelings of excitement along with confusing changes in moods, emotions, and thoughts. You may feel happy one minute and sad or stressed the next. These feelings of sadness usually happen in the period right after you have your baby, and they go away within a week or two. This is called the "baby blues." What are the causes? There is no known cause of baby blues. It is likely caused by a combination of factors. However, changes in hormone levels after childbirth are believed to trigger some of the symptoms. Other factors that can play a role in these mood changes include:  Lack of sleep.  Stressful life events, such as poverty, caring for a loved one, or death of a loved one.  Genetics. What are the signs or symptoms? Symptoms of this condition include:  Brief changes in mood, such as going from extreme happiness to sadness.  Decreased concentration.  Difficulty sleeping.  Crying spells and tearfulness.  Loss of appetite.  Irritability.  Anxiety. If the symptoms of baby blues last for more than 2 weeks or become more severe, you may have postpartum depression. How is this diagnosed? This condition is diagnosed based on an evaluation of your symptoms. There are no medical or lab tests that lead to a diagnosis, but there are various questionnaires that a health care provider may use to identify women with the baby blues or postpartum depression. How is this treated? Treatment is not needed for this condition. The baby blues usually go away on their own in 1-2 weeks. Social support is often all that is needed. You will  be encouraged to get adequate sleep and rest. Follow these instructions at home: Lifestyle      Get as much rest as you can. Take a nap when the baby sleeps.  Exercise regularly as told by your health care provider. Some women find yoga and walking to be helpful.  Eat a balanced and nourishing diet. This includes plenty of fruits and vegetables, whole grains, and lean proteins.  Do little things that you enjoy. Have a cup of tea, take a bubble bath, read your favorite magazine, or listen to your favorite music.  Avoid alcohol.  Ask for help with household chores, cooking, grocery shopping, or running errands. Do not try to do everything yourself. Consider hiring a postpartum doula to help. This is a professional who specializes in providing support to new mothers.  Try not to make any major life changes during pregnancy or right after giving birth. This can add stress. General instructions  Talk to people close to you about how you are feeling. Get support from your partner, family members, friends, or other new moms. You may want to join a support group.  Find ways to cope with stress. This may include: ? Writing your thoughts and feelings in a journal. ? Spending time outside. ? Spending time with people who make you laugh.  Try to stay positive in how you think. Think about the things you are grateful for.  Take over-the-counter and prescription medicines only as told by your health care provider.    Let your health care provider know if you have any concerns.  Keep all postpartum visits as told by your health care provider. This is important. Contact a health care provider if:  Your baby blues do not go away after 2 weeks. Get help right away if:  You have thoughts of taking your own life (suicidal thoughts).  You think you may harm the baby or other people.  You see or hear things that are not there (hallucinations). Summary  After giving birth, you may feel happy  one minute and sad or stressed the next. Feelings of sadness that happen right after the baby is born and go away after a week or two are called the "baby blues."  You can manage the baby blues by getting enough rest, eating a healthy diet, exercising, spending time with supportive people, and finding ways to cope with stress.  If feelings of sadness and stress last longer than 2 weeks or get in the way of caring for your baby, talk to your health care provider. This may mean you have postpartum depression. This information is not intended to replace advice given to you by your health care provider. Make sure you discuss any questions you have with your health care provider. Document Revised: 01/09/2019 Document Reviewed: 11/13/2016 Elsevier Patient Education  2020 [employer]  Perinatal Anxiety When a woman feels excessive tension or worry (anxiety) during pregnancy or during the first 12 months after she gives birth, she has a condition called perinatal anxiety. Anxiety can interfere with work, school, relationships, and other everyday activities. If it is not managed properly, it can also cause problems in the mother and her baby.  If you are pregnant and you have symptoms of an anxiety disorder, it is important to talk with your health care provider. What are the causes? The exact cause of this condition is not known. Hormonal changes during and after pregnancy may play a role in causing perinatal anxiety. What increases the risk? You are more likely to develop this condition if:  You have a personal or family history of depression, anxiety, or mood disorders.  You experience a stressful life event during pregnancy, such as the death of a loved one.  You have a lot of regular life stress, such as being a single parent.  You have thyroid problems. What are the signs or symptoms? Perinatal anxiety can be different for everyone. It may include:  Panic attacks (panic disorder). These  are intense episodes of fear or discomfort that may also cause sweating, nausea, shortness of breath, or fear of dying. They usually last 5-15 minutes.  Reliving an upsetting (traumatic) event through distressing thoughts, dreams, or flashbacks (post-traumatic stress disorder, or PTSD).  Excessive worry about multiple problems (generalized anxiety disorder).  Fear and stress about leaving certain people or loved ones (separation anxiety).  Performing repetitive tasks (compulsions) to relieve stress or worry (obsessive compulsive disorder, or OCD).  Fear of certain objects or situations (phobias).  Excessive worrying, such as a constant feeling that something bad is going to happen.  Inability to relax.  Difficulty concentrating.  Sleep problems.  Frequent nightmares or disturbing thoughts. How is this diagnosed? This condition is diagnosed based on a physical exam and mental evaluation. In some cases, your health care provider may use an anxiety screening tool. These tools include a list of questions that can help a health care provider diagnose anxiety. Your health care provider may refer you to a mental health expert who  specializes in anxiety. How is this treated? This condition may be treated with:  Medicines. Your health care provider will only give you medicines that have been proven safe for pregnancy and breastfeeding.  Talk therapy with a mental health professional to help change your patterns of thinking (cognitive behavioral therapy).  Mindfulness-based stress reduction.  Other relaxation therapies, such as deep breathing or guided muscle relaxation.  Support groups. Follow these instructions at home: Lifestyle  Do not use any products that contain nicotine or tobacco, such as cigarettes and e-cigarettes. If you need help quitting, ask your health care provider.  Do not use alcohol when you are pregnant. After your baby is born, limit alcohol intake to no more than  1 drink a day. One drink equals 12 oz of beer, 5 oz of wine, or 1 oz of hard liquor.  Consider joining a support group for new mothers. Ask your health care provider for recommendations.  Take good care of yourself. Make sure you: ? Get plenty of sleep. If you are having trouble sleeping, talk with your health care provider. ? Eat a healthy diet. This includes plenty of fruits and vegetables, whole grains, and lean proteins. ? Exercise regularly, as told by your health care provider. Ask your health care provider what exercises are safe for you. General instructions  Take over-the-counter and prescription medicines only as told by your health care provider.  Talk with your partner or family members about your feelings during pregnancy. Share any concerns or fears that you may have.  Ask for help with tasks or chores when you need it. Ask friends and family members to provide meals, watch your children, or help with cleaning.  Keep all follow-up visits as told by your health care provider. This is important. Contact a health care provider if:  You (or people close to you) notice that you have any symptoms of anxiety or depression.  You have anxiety and your symptoms get worse.  You experience side effects from medicines, such as nausea or sleep problems. Get help right away if:  You feel like hurting yourself, your baby, or someone else. If you ever feel like you may hurt yourself or others, or have thoughts about taking your own life, get help right away. You can go to your nearest emergency department or call:  Your local emergency services (911 in the [place]  A suicide crisis helpline, such as the National Suicide Prevention Lifeline at 3102570556. This is open 24 hours a day. Summary  Perinatal anxiety is when a woman feels excessive tension or worry during pregnancy or during the first 12 months after she gives birth.  Perinatal anxiety may include panic attacks,  post-traumatic stress disorder, separation anxiety, phobias, or generalized anxiety.  Perinatal anxiety can cause physical health problems in the mother and baby if not properly managed.  This condition is treated with medicines, talk therapy, stress reduction therapies, or a combination of two or more treatments.  Talk with your partner or family members about your concerns or fears. Do not be afraid to ask for help. This information is not intended to replace advice given to you by your health care provider. Make sure you discuss any questions you have with your health care provider. Document Revised: 09/20/2017 Document Reviewed: 11/14/2016 Elsevier Patient Education  2020 [employer].

## 2020-07-18 NOTE — Progress Notes (Signed)
Virtual Visit via Telephone Note  I connected with Karen Hanna on 07/18/20 at  4:15 PM EDT by telephone and verified that I am speaking with the correct person using two identifiers.  Location:  Patient: Karen Hanna (Home)  Provider: Serafina Royals, CNM ([facility], [facility])   I discussed the limitations, risks, security and privacy concerns of performing an evaluation and management service by telephone and the availability of in person appointments. I also discussed with the patient that there may be a patient responsible charge related to this service. The patient expressed understanding and agreed to proceed.   History of Present Illness:  Patient is two (2) week status post spontaneous vaginal birth, states she is doing well.   Bleeding is light. Successfully breastfeeding. Eating, drinking, and voiding without difficulty.   Notes mild pain and pinching with bowel movements.   Sleeping when the baby sleeps.   History of depression on medication prior to pregnancy, not ready to start at this time.   Denies difficulty breathing or respiratory distress, chest pain, abdominal pain, excessive vaginal bleeding, dysuria, and leg pain or swelling.     Observations/Objective:  Depression screen [facility] 2/9 07/18/2020 06/27/2020 01/20/2020 11/18/2019 06/25/2019  Decreased Interest 0 2 1 1 3   Down, Depressed, Hopeless 1 2 1 3 3   PHQ - 2 Score 1 4 2 4 6   Altered sleeping 1 2 2 1 2   Tired, decreased energy 0 3 2 3 2   Change in appetite 0 1 1 3 2   Feeling bad or failure about yourself  1 1 1 1 1   Trouble concentrating 0 1 0 3 1  Moving slowly or fidgety/restless 1 1 2 1 1   Suicidal thoughts 0 1 0 1 1  PHQ-9 Score 4 14 10 17 16   Difficult doing work/chores - Somewhat difficult Somewhat difficult Very difficult Somewhat difficult  Some recent data might be hidden   GAD 7 : Generalized Anxiety Score 07/18/2020 11/18/2019 01/14/2019 10/30/2018  Nervous, Anxious, on Edge 1 3 1 3    Control/stop worrying 1 3 1 2   Worry too much - different things 1 3 1 3   Trouble relaxing 1 3 1 2   Restless 1 3 3 2   Easily annoyed or irritable 1 3 1 2   Afraid - awful might happen 1 1 1 2   Total GAD 7 Score 7 19 9 16   Anxiety Difficulty - Very difficult Very difficult Somewhat difficult    Assessment and Plan:  Postpartum care and examination Lactating mother Depression screen negative Perinatal anxiety  Follow Up Instructions:  Routine postpartum education.   Encouraged home treatment measures.   Reviewed red flag symptoms and when to call.   RTC as previously scheduled or sooner if needed.    I discussed the assessment and treatment plan with the patient. The patient was provided an opportunity to ask questions and all were answered. The patient agreed with the plan and demonstrated an understanding of the instructions.   The patient was advised to call back or seek an in-person evaluation if the symptoms worsen or if the condition fails to improve as anticipated.  I provided 6 minutes of non-face-to-face time during this encounter.   , CNM [facility], [facility] 07/18/20 4:49 PM

## 2020-08-15 ENCOUNTER — Encounter: Payer: Self-pay | Admitting: Certified Nurse Midwife

## 2020-08-15 ENCOUNTER — Ambulatory Visit (INDEPENDENT_AMBULATORY_CARE_PROVIDER_SITE_OTHER): Payer: BC Managed Care – PPO | Admitting: Certified Nurse Midwife

## 2020-08-15 ENCOUNTER — Other Ambulatory Visit: Payer: Self-pay

## 2020-08-15 NOTE — Progress Notes (Signed)
Subjective:    Karen Hanna is a 29 y.o. G52P1011 Hispanic female who presents for a postpartum visit. She is 6 weeks postpartum following a spontaneous vaginal delivery at 40+1 gestational weeks. Anesthesia: local and epidural. I have fully reviewed the prenatal and intrapartum course.   Postpartum course has been uncomplicated except for anxiety.   Baby's course has been uncomplicated. Baby is feeding by breast.   Bleeding no bleeding. Bowel function is normal. Bladder function is normal.   Patient is not sexually active. Contraception method is abstinence.  Postpartum depression screening: negative. Score 8.    Last pap 06/2019 and was Neg/Neg.  Denies difficulty breathing or respiratory distress, chest pain, abdominal pain, dysuria, and leg pain or swelling.   The following portions of the patient's history were reviewed and updated as appropriate: allergies, current medications, past medical history, past surgical history and problem list.  Review of Systems  Pertinent items are noted in HPI.   Objective:   BP 101/69   Pulse 72   Ht 5\' 5"  (1.651 m)   Wt 231 lb 1.6 oz (104.8 kg)   Breastfeeding Yes   BMI 38.46 kg/m   General:  Alert, cooperative and no distress   Breasts:  Left breast wnl except for clogged duct at 3 o'clock  Lungs: Clear to auscultation bilaterally  Heart:  Regular rate and rhythm  Abdomen: Soft, nontender   Vulva: Normal  Vagina: Normal vagina  Cervix:  Closed  Corpus: Well-involuted  Adnexa:  Non-palpable      Depression screen [facility] 2/9 07/18/2020 06/27/2020 01/20/2020 11/18/2019 06/25/2019  Decreased Interest 0 2 1 1 3   Down, Depressed, Hopeless 1 2 1 3 3   PHQ - 2 Score 1 4 2 4 6   Altered sleeping 1 2 2 1 2   Tired, decreased energy 0 3 2 3 2   Change in appetite 0 1 1 3 2   Feeling bad or failure about yourself  1 1 1 1 1   Trouble concentrating 0 1 0 3 1  Moving slowly or fidgety/restless 1 1 2 1 1   Suicidal thoughts 0 1 0 1 1  PHQ-9 Score 4  14 10 17 16   Difficult doing work/chores - Somewhat difficult Somewhat difficult Very difficult Somewhat difficult  Some recent data might be hidden   GAD 7 : Generalized Anxiety Score 07/18/2020 11/18/2019 01/14/2019 10/30/2018  Nervous, Anxious, on Edge 1 3 1 3   Control/stop worrying 1 3 1 2   Worry too much - different things 1 3 1 3   Trouble relaxing 1 3 1 2   Restless 1 3 3 2   Easily annoyed or irritable 1 3 1 2   Afraid - awful might happen 1 1 1 2   Total GAD 7 Score 7 19 9 16   Anxiety Difficulty - Very difficult Very difficult Somewhat difficult       Assessment:   Postpartum exam Six (6) wks s/p spontaneous vaginal birth Breastfeeding Depression screening Contraception counseling   Plan:   Encouraged routine health maintenance techniques, see AVS.   Patient does not wish to restart antidepressant at this time.   Reviewed red flag symptoms and when to call.   Follow up in: 3 months for ANNUAL EXAM or earlier if needed.   , CNM [facility], [facility] 08/15/20 9:49 AM

## 2020-08-15 NOTE — Patient Instructions (Signed)
Contraception Choices Contraception, also called birth control, means things to use or ways to try not to get pregnant. Hormonal birth control This kind of birth control uses hormones. Here are some types of hormonal birth control:  A tube that is put under skin of the arm (implant). The tube can stay in for as long as 3 years.  Shots to get every 3 months (injections).  Pills to take every day (birth control pills).  A patch to change 1 time each week for 3 weeks (birth control patch). After that, the patch is taken off for 1 week.  A ring to put in the vagina. The ring is left in for 3 weeks. Then it is taken out of the vagina for 1 week. Then a new ring is put in.  Pills to take after unprotected sex (emergency birth control pills). Barrier birth control Here are some types of barrier birth control:  A thin covering that is put on the penis before sex (female condom). The covering is thrown away after sex.  A soft, loose covering that is put in the vagina before sex (female condom). The covering is thrown away after sex.  A rubber bowl that sits over the cervix (diaphragm). The bowl must be made for you. The bowl is put into the vagina before sex. The bowl is left in for 6-8 hours after sex. It is taken out within 24 hours.  A small, soft cup that fits over the cervix (cervical cap). The cup must be made for you. The cup can be left in for 6-8 hours after sex. It is taken out within 48 hours.  A sponge that is put into the vagina before sex. It must be left in for at least 6 hours after sex. It must be taken out within 30 hours. Then it is thrown away.  A chemical that kills or stops sperm from getting into the uterus (spermicide). It may be a pill, cream, jelly, or foam to put in the vagina. The chemical should be used at least 10-15 minutes before sex. IUD (intrauterine) birth control An IUD is a small, T-shaped piece of plastic. It is put inside the uterus. There are two  kinds:  Hormone IUD. This kind can stay in for 3-5 years.  Copper IUD. This kind can stay in for 10 years. Permanent birth control Here are some types of permanent birth control:  Surgery to block the fallopian tubes.  Having an insert put into each fallopian tube.  Surgery to tie off the tubes that carry sperm (vasectomy). Natural planning birth control Here are some types of natural planning birth control:  Not having sex on the days the woman could get pregnant.  Using a calendar: ? To keep track of the length of each period. ? To find out what days pregnancy can happen. ? To plan to not have sex on days when pregnancy can happen.  Watching for symptoms of ovulation and not having sex during ovulation. One way the woman can check for ovulation is to check her temperature.  Waiting to have sex until after ovulation. Summary  Contraception, also called birth control, means things to use or ways to try not to get pregnant.  Hormonal methods of birth control include implants, injections, pills, patches, vaginal rings, and emergency birth control pills.  Barrier methods of birth control can include female condoms, female condoms, diaphragms, cervical caps, sponges, and spermicides.  There are two types of IUD (intrauterine device) birth control.  An IUD can be put in a woman's uterus to prevent pregnancy for 3-5 years.  Permanent sterilization can be done through a procedure for males, females, or both.  Natural planning methods involve not having sex on the days when the woman could get pregnant. This information is not intended to replace advice given to you by your health care provider. Make sure you discuss any questions you have with your health care provider. Document Revised: 01/07/2019 Document Reviewed: 09/27/2016 Elsevier Patient Education  2020 [place] 14-74 Years Old, Female Preventive care refers to visits with your health care provider  and lifestyle choices that can promote health and wellness. This includes:  A yearly physical exam. This may also be called an annual well check.  Regular dental visits and eye exams.  Immunizations.  Screening for certain conditions.  Healthy lifestyle choices, such as eating a healthy diet, getting regular exercise, not using drugs or products that contain nicotine and tobacco, and limiting alcohol use. What can I expect for my preventive care visit? Physical exam Your health care provider will check your:  Height and weight. This may be used to calculate body mass index (BMI), which tells if you are at a healthy weight.  Heart rate and blood pressure.  Skin for abnormal spots. Counseling Your health care provider may ask you questions about your:  Alcohol, tobacco, and drug use.  Emotional well-being.  Home and relationship well-being.  Sexual activity.  Eating habits.  Work and work Statistician.  Method of birth control.  Menstrual cycle.  Pregnancy history. What immunizations do I need?  Influenza (flu) vaccine  This is recommended every year. Tetanus, diphtheria, and pertussis (Tdap) vaccine  You may need a Td booster every 10 years. Varicella (chickenpox) vaccine  You may need this if you have not been vaccinated. Human papillomavirus (HPV) vaccine  If recommended by your health care provider, you may need three doses over 6 months. Measles, mumps, and rubella (MMR) vaccine  You may need at least one dose of MMR. You may also need a second dose. Meningococcal conjugate (MenACWY) vaccine  One dose is recommended if you are age 5-21 years and a first-year college student living in a residence hall, or if you have one of several medical conditions. You may also need additional booster doses. Pneumococcal conjugate (PCV13) vaccine  You may need this if you have certain conditions and were not previously vaccinated. Pneumococcal polysaccharide  (PPSV23) vaccine  You may need one or two doses if you smoke cigarettes or if you have certain conditions. Hepatitis A vaccine  You may need this if you have certain conditions or if you travel or work in places where you may be exposed to hepatitis A. Hepatitis B vaccine  You may need this if you have certain conditions or if you travel or work in places where you may be exposed to hepatitis B. Haemophilus influenzae type b (Hib) vaccine  You may need this if you have certain conditions. You may receive vaccines as individual doses or as more than one vaccine together in one shot (combination vaccines). Talk with your health care provider about the risks and benefits of combination vaccines. What tests do I need?  Blood tests  Lipid and cholesterol levels. These may be checked every 5 years starting at age 74.  Hepatitis C test.  Hepatitis B test. Screening  Diabetes screening. This is done by checking your blood sugar (glucose) after you have not eaten  for a while (fasting).  Sexually transmitted disease (STD) testing.  BRCA-related cancer screening. This may be done if you have a family history of breast, ovarian, tubal, or peritoneal cancers.  Pelvic exam and Pap test. This may be done every 3 years starting at age 33. Starting at age 6, this may be done every 5 years if you have a Pap test in combination with an HPV test. Talk with your health care provider about your test results, treatment options, and if necessary, the need for more tests. Follow these instructions at home: Eating and drinking   Eat a diet that includes fresh fruits and vegetables, whole grains, lean protein, and low-fat dairy.  Take vitamin and mineral supplements as recommended by your health care provider.  Do not drink alcohol if: ? Your health care provider tells you not to drink. ? You are pregnant, may be pregnant, or are planning to become pregnant.  If you drink alcohol: ? Limit how much  you have to 0-1 drink a day. ? Be aware of how much alcohol is in your drink. In the [place] one drink equals one 12 oz bottle of beer (355 mL), one 5 oz glass of wine (148 mL), or one 1 oz glass of hard liquor (44 mL). Lifestyle  Take daily care of your teeth and gums.  Stay active. Exercise for at least 30 minutes on 5 or more days each week.  Do not use any products that contain nicotine or tobacco, such as cigarettes, e-cigarettes, and chewing tobacco. If you need help quitting, ask your health care provider.  If you are sexually active, practice safe sex. Use a condom or other form of birth control (contraception) in order to prevent pregnancy and STIs (sexually transmitted infections). If you plan to become pregnant, see your health care provider for a preconception visit. What's next?  Visit your health care provider once a year for a well check visit.  Ask your health care provider how often you should have your eyes and teeth checked.  Stay up to date on all vaccines. This information is not intended to replace advice given to you by your health care provider. Make sure you discuss any questions you have with your health care provider. Document Revised: 05/29/2018 Document Reviewed: 05/29/2018 Elsevier Patient Education  [place] When a woman feels excessive tension or worry (anxiety) during pregnancy or during the first 12 months after she gives birth, she has a condition called perinatal anxiety. Anxiety can interfere with work, school, relationships, and other everyday activities. If it is not managed properly, it can also cause problems in the mother and her baby.  If you are pregnant and you have symptoms of an anxiety disorder, it is important to talk with your health care provider. What are the causes? The exact cause of this condition is not known. Hormonal changes during and after pregnancy may play a role in causing perinatal anxiety. What  increases the risk? You are more likely to develop this condition if:  You have a personal or family history of depression, anxiety, or mood disorders.  You experience a stressful life event during pregnancy, such as the death of a loved one.  You have a lot of regular life stress, such as being a single parent.  You have thyroid problems. What are the signs or symptoms? Perinatal anxiety can be different for everyone. It may include:  Panic attacks (panic disorder). These are intense episodes of  fear or discomfort that may also cause sweating, nausea, shortness of breath, or fear of dying. They usually last 5-15 minutes.  Reliving an upsetting (traumatic) event through distressing thoughts, dreams, or flashbacks (post-traumatic stress disorder, or PTSD).  Excessive worry about multiple problems (generalized anxiety disorder).  Fear and stress about leaving certain people or loved ones (separation anxiety).  Performing repetitive tasks (compulsions) to relieve stress or worry (obsessive compulsive disorder, or OCD).  Fear of certain objects or situations (phobias).  Excessive worrying, such as a constant feeling that something bad is going to happen.  Inability to relax.  Difficulty concentrating.  Sleep problems.  Frequent nightmares or disturbing thoughts. How is this diagnosed? This condition is diagnosed based on a physical exam and mental evaluation. In some cases, your health care provider may use an anxiety screening tool. These tools include a list of questions that can help a health care provider diagnose anxiety. Your health care provider may refer you to a mental health expert who specializes in anxiety. How is this treated? This condition may be treated with:  Medicines. Your health care provider will only give you medicines that have been proven safe for pregnancy and breastfeeding.  Talk therapy with a mental health professional to help change your patterns of  thinking (cognitive behavioral therapy).  Mindfulness-based stress reduction.  Other relaxation therapies, such as deep breathing or guided muscle relaxation.  Support groups. Follow these instructions at home: Lifestyle  Do not use any products that contain nicotine or tobacco, such as cigarettes and e-cigarettes. If you need help quitting, ask your health care provider.  Do not use alcohol when you are pregnant. After your baby is born, limit alcohol intake to no more than 1 drink a day. One drink equals 12 oz of beer, 5 oz of wine, or 1 oz of hard liquor.  Consider joining a support group for new mothers. Ask your health care provider for recommendations.  Take good care of yourself. Make sure you: ? Get plenty of sleep. If you are having trouble sleeping, talk with your health care provider. ? Eat a healthy diet. This includes plenty of fruits and vegetables, whole grains, and lean proteins. ? Exercise regularly, as told by your health care provider. Ask your health care provider what exercises are safe for you. General instructions  Take over-the-counter and prescription medicines only as told by your health care provider.  Talk with your partner or family members about your feelings during pregnancy. Share any concerns or fears that you may have.  Ask for help with tasks or chores when you need it. Ask friends and family members to provide meals, watch your children, or help with cleaning.  Keep all follow-up visits as told by your health care provider. This is important. Contact a health care provider if:  You (or people close to you) notice that you have any symptoms of anxiety or depression.  You have anxiety and your symptoms get worse.  You experience side effects from medicines, such as nausea or sleep problems. Get help right away if:  You feel like hurting yourself, your baby, or someone else. If you ever feel like you may hurt yourself or others, or have  thoughts about taking your own life, get help right away. You can go to your nearest emergency department or call:  Your local emergency services (911 in the [place]  A suicide crisis helpline, such as the [facility] at 360 555 5572. This is open 24 hours  a day. Summary  Perinatal anxiety is when a woman feels excessive tension or worry during pregnancy or during the first 12 months after she gives birth.  Perinatal anxiety may include panic attacks, post-traumatic stress disorder, separation anxiety, phobias, or generalized anxiety.  Perinatal anxiety can cause physical health problems in the mother and baby if not properly managed.  This condition is treated with medicines, talk therapy, stress reduction therapies, or a combination of two or more treatments.  Talk with your partner or family members about your concerns or fears. Do not be afraid to ask for help. This information is not intended to replace advice given to you by your health care provider. Make sure you discuss any questions you have with your health care provider. Document Revised: 09/20/2017 Document Reviewed: 11/14/2016 Elsevier Patient Education  [place].

## 2020-10-26 ENCOUNTER — Telehealth (INDEPENDENT_AMBULATORY_CARE_PROVIDER_SITE_OTHER): Payer: BC Managed Care – PPO | Admitting: Family Medicine

## 2020-10-26 ENCOUNTER — Other Ambulatory Visit: Payer: Self-pay

## 2020-10-26 ENCOUNTER — Encounter: Payer: Self-pay | Admitting: Family Medicine

## 2020-10-26 DIAGNOSIS — U071 COVID-19: Secondary | ICD-10-CM | POA: Insufficient documentation

## 2020-10-26 MED ORDER — ALBUTEROL SULFATE HFA 108 (90 BASE) MCG/ACT IN AERS: 2.0000 | INHALATION_SPRAY | Freq: Four times a day (QID) | RESPIRATORY_TRACT | 0 refills | Status: AC | PRN

## 2020-10-26 MED ORDER — GUAIFENESIN ER 600 MG PO TB12: 1200.0000 mg | ORAL_TABLET | Freq: Two times a day (BID) | ORAL | 0 refills | Status: AC

## 2020-10-26 NOTE — Progress Notes (Signed)
Virtual Visit via Telephone  The purpose of this virtual visit is to provide medical care while limiting exposure to the novel coronavirus (COVID19) for both patient and office staff.  Consent was obtained for phone visit:  Yes.   Answered questions that patient had about telehealth interaction:  Yes.   I discussed the limitations, risks, security and privacy concerns of performing an evaluation and management service by telephone. I also discussed with the patient that there may be a patient responsible charge related to this service. The patient expressed understanding and agreed to proceed.  Patient is at home and is accessed via telephone Services are provided by Charlaine Dalton, FNP-C from [facility])  ---------------------------------------------------------------------- Chief Complaint  Patient presents with  . Covid Positive    Productive cough, nasal congestion, fatigue, chest congestion and scratchy throat. Positive COVID test x 5 days ago. Her symptoms started x8 days ago with a headache.SOB, fever, and diarrhea have subsided     S: Reviewed CMA documentation. I have called patient and gathered additional HPI as follows:  Patient presents for virtual telemedicine visit via telephone with concerns of testing positive for COVID 5 days ago with symptom onset 8 days ago.  Reports she had a productive cough with nasal and chest congestion, fatigue, fever, headaches, some shortness of breath with exertion and diarrhea.  Reports the headache and fever have subsided and her other symptoms are progressively getting better each day.  Has been using herbal teas and using a humidifier to help with her symptoms.  Patient is currently home Denies any high risk travel to areas of current concern for COVID19.  Past Medical History:  Diagnosis Date  . Depression 06/25/2019   Social History   Tobacco Use  . Smoking status: Former Smoker    Years: 11.00    Quit  date: 01/28/2018    Years since quitting: 2.7  . Smokeless tobacco: Never Used  Vaping Use  . Vaping Use: Never used  Substance Use Topics  . Alcohol use: Not Currently    Alcohol/week: 1.0 standard drink    Types: 1 Glasses of wine per week  . Drug use: Never    Current Outpatient Medications:  .  acetaminophen (TYLENOL) 325 MG tablet, Take 650 mg by mouth every 6 (six) hours as needed., Disp: , Rfl:  .  albuterol (VENTOLIN HFA) 108 (90 Base) MCG/ACT inhaler, Inhale 2 puffs into the lungs every 6 (six) hours as needed for wheezing or shortness of breath., Disp: 8 g, Rfl: 0 .  ferrous sulfate 325 (65 FE) MG tablet, Take 1 tablet (325 mg total) by mouth 2 (two) times daily with a meal., Disp: 60 tablet, Rfl: 3 .  guaiFENesin (MUCINEX) 600 MG 12 hr tablet, Take 2 tablets (1,200 mg total) by mouth 2 (two) times daily for 10 days., Disp: 40 tablet, Rfl: 0 .  Prenatal Vit-Fe Fumarate-FA (MULTIVITAMIN-PRENATAL) 27-0.8 MG TABS tablet, Take 1 tablet by mouth daily at 12 noon., Disp: , Rfl:   Depression screen [facility] 2/9 08/15/2020 07/18/2020 06/27/2020  Decreased Interest 1 0 2  Down, Depressed, Hopeless 1 1 2   PHQ - 2 Score 2 1 4   Altered sleeping 0 1 2  Tired, decreased energy 1 0 3  Change in appetite 2 0 1  Feeling bad or failure about yourself  2 1 1   Trouble concentrating 0 0 1  Moving slowly or fidgety/restless 1 1 1   Suicidal thoughts 0 0 1  PHQ-9 Score  8 4 14   Difficult doing work/chores Somewhat difficult - Somewhat difficult  Some recent data might be hidden    GAD 7 : Generalized Anxiety Score 08/15/2020 07/18/2020 11/18/2019 01/14/2019  Nervous, Anxious, on Edge 2 1 3 1   Control/stop worrying 3 1 3 1   Worry too much - different things 2 1 3 1   Trouble relaxing 2 1 3 1   Restless 1 1 3 3   Easily annoyed or irritable 1 1 3 1   Afraid - awful might happen 1 1 1 1   Total GAD 7 Score 12 7 19 9   Anxiety Difficulty - - Very difficult Very difficult     -------------------------------------------------------------------------- O: No physical exam performed due to remote telephone encounter.  Physical Exam: Patient remotely monitored without video.  Verbal communication appropriate.  Cognition normal.  No results found for this or any previous visit (from the past 2160 hour(s)).  -------------------------------------------------------------------------- A&P:  Problem List Items Addressed This Visit      Other   COVID-19 - Primary    Recent diagnosis with COVID-19, with improvement in symptoms.  Will refill albuterol inhaler and send in prescription for mucinex 1200mg  BID x 10 days to help with chest/nasal congestion.  To RTC if symptoms worsen or fail to improve.      Relevant Medications   albuterol (VENTOLIN HFA) 108 (90 Base) MCG/ACT inhaler   guaiFENesin (MUCINEX) 600 MG 12 hr tablet      Meds ordered this encounter  Medications  . albuterol (VENTOLIN HFA) 108 (90 Base) MCG/ACT inhaler    Sig: Inhale 2 puffs into the lungs every 6 (six) hours as needed for wheezing or shortness of breath.    Dispense:  8 g    Refill:  0  . guaiFENesin (MUCINEX) 600 MG 12 hr tablet    Sig: Take 2 tablets (1,200 mg total) by mouth 2 (two) times daily for 10 days.    Dispense:  40 tablet    Refill:  0    Follow-up: - Return if symptoms worsen or fail to improve  Patient verbalizes understanding with the above medical recommendations including the limitation of remote medical advice.  Specific follow-up and call-back criteria were given for patient to follow-up or seek medical care more urgently if needed.  - Time spent in direct consultation with patient on phone: 6 minutes  , FNP-C [facility] 10/26/2020, 3:22 PM

## 2020-10-26 NOTE — Assessment & Plan Note (Signed)
Recent diagnosis with COVID-19, with improvement in symptoms.  Will refill albuterol inhaler and send in prescription for mucinex 1200mg  BID x 10 days to help with chest/nasal congestion.  To RTC if symptoms worsen or fail to improve.

## 2020-10-28 ENCOUNTER — Telehealth: Payer: Self-pay | Admitting: Family Medicine

## 2020-10-28 NOTE — Telephone Encounter (Signed)
Completed her Short Term Disability Forms that we received regarding COVID19 illness.  However, I do not have several dates required to actually complete and submit the form - it is on top of my inbox in my office, will need the dates filled in and it is already signed and ready to be faxed  Due date is by 11/03/20.  Saralyn Pilar, DO [facility] 10/28/2020, 4:50 PM

## 2020-10-31 NOTE — Telephone Encounter (Signed)
I Attempted to contact the patient to notify her that we need some information in order to complete her short term disability. Mail box full, unable to leave a message.

## 2020-10-31 NOTE — Telephone Encounter (Signed)
I contacted the patient to f/u on pertinant dates needed to complete her disability forms. 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 She was scheduled to return to work on 10/25/2020, but she came down with COVID. Her symptoms started on Tuesday, 10/18/20 and she got tested on Friday 10/21/20.   The pt had a virtually visit on 10/26/20 with Joni Reining to address her symptoms. When I ask the patient about return to work date she informed me that she is still having intermittent fever spikes. Appt scheduled with the covering provider for Tuesday.

## 2020-11-01 ENCOUNTER — Ambulatory Visit
Admission: RE | Admit: 2020-11-01 | Discharge: 2020-11-01 | Disposition: A | Discharge status: Home / Self Care | Payer: BC Managed Care – PPO | Source: Ambulatory Visit | Attending: Unknown Physician Specialty | Admitting: Unknown Physician Specialty

## 2020-11-01 ENCOUNTER — Encounter: Payer: Self-pay | Admitting: Unknown Physician Specialty

## 2020-11-01 ENCOUNTER — Ambulatory Visit (INDEPENDENT_AMBULATORY_CARE_PROVIDER_SITE_OTHER): Payer: BC Managed Care – PPO | Admitting: Unknown Physician Specialty

## 2020-11-01 ENCOUNTER — Other Ambulatory Visit: Payer: Self-pay

## 2020-11-01 ENCOUNTER — Ambulatory Visit
Admission: RE | Admit: 2020-11-01 | Discharge: 2020-11-01 | Disposition: A | Discharge status: Home / Self Care | Payer: BC Managed Care – PPO | Attending: Unknown Physician Specialty | Admitting: Unknown Physician Specialty

## 2020-11-01 VITALS — BP 148/76 | HR 126 | Temp 98.3°F | Wt 219.0 lb

## 2020-11-01 DIAGNOSIS — J4521 Mild intermittent asthma with (acute) exacerbation: Secondary | ICD-10-CM | POA: Diagnosis not present

## 2020-11-01 DIAGNOSIS — U071 COVID-19: Secondary | ICD-10-CM

## 2020-11-01 MED ORDER — PREDNISONE 50 MG PO TABS: 50.0000 mg | ORAL_TABLET | Freq: Every day | ORAL | 0 refills | Status: DC

## 2020-11-01 MED ORDER — DULERA 200-5 MCG/ACT IN AERO: 2.0000 | INHALATION_SPRAY | Freq: Two times a day (BID) | RESPIRATORY_TRACT | 1 refills | Status: AC

## 2020-11-01 MED ORDER — AZITHROMYCIN 250 MG PO TABS: ORAL_TABLET | ORAL | 0 refills | Status: DC

## 2020-11-01 NOTE — Progress Notes (Signed)
BP (!) 148/76   Pulse (!) 126   Temp 98.3 F (36.8 C) (Oral)   Wt 219 lb (99.3 kg)   SpO2 96%   BMI 36.44 kg/m    Subjective:    Patient ID: Karen Hanna, female    DOB: 03/19/1991, 30 y.o.   MRN: [id number]  HPI: Karen Hanna is a 30 y.o. female  Chief Complaint  Patient presents with  . Shortness of Breath  . Chills  . Fatigue  . chest congestion  . Cough   Shortness of Breath Chronicity: dx with nCopvid 1/21.  Symptoms since 1/18.  Persistant symptoms since then.   The problem occurs constantly. The problem has been waxing and waning. Associated symptoms include abdominal pain, ear pain, a fever, headaches, neck pain, orthopnea, a sore throat, swollen glands and wheezing. Pertinent negatives include no chest pain, claudication, coryza, hemoptysis, leg pain, leg swelling, rash, rhinorrhea, sputum production, syncope or vomiting. Exacerbated by: mucinex. Associated symptoms comments: nausea.  Cough Associated symptoms include ear pain, a fever, headaches, a sore throat, shortness of breath and wheezing. Pertinent negatives include no chest pain, hemoptysis, rash or rhinorrhea.    Relevant past medical, surgical, family and social history reviewed and updated as indicated. Interim medical history since our last visit reviewed. Allergies and medications reviewed and updated.  Review of Systems  Constitutional: Positive for fever.  HENT: Positive for ear pain and sore throat. Negative for rhinorrhea.   Respiratory: Positive for cough, shortness of breath and wheezing. Negative for hemoptysis and sputum production.   Cardiovascular: Positive for orthopnea. Negative for chest pain, claudication, leg swelling and syncope.  Gastrointestinal: Positive for abdominal pain. Negative for vomiting.  Musculoskeletal: Positive for neck pain.  Skin: Negative for rash.  Neurological: Positive for headaches.    Per HPI unless specifically indicated above     Objective:    BP (!)  148/76   Pulse (!) 126   Temp 98.3 F (36.8 C) (Oral)   Wt 219 lb (99.3 kg)   SpO2 96%   BMI 36.44 kg/m   Wt Readings from Last 3 Encounters:  11/01/20 219 lb (99.3 kg)  08/15/20 231 lb 1.6 oz (104.8 kg)  07/04/20 235 lb (106.6 kg)    Physical Exam Constitutional:      Appearance: She is ill-appearing.  HENT:     Head: Normocephalic.     Mouth/Throat:     Mouth: Mucous membranes are moist.  Cardiovascular:     Rate and Rhythm: Regular rhythm. Tachycardia present.  Pulmonary:     Breath sounds: Decreased air movement present. Examination of the right-upper field reveals wheezing. Examination of the left-upper field reveals wheezing. Examination of the right-middle field reveals wheezing. Examination of the left-middle field reveals wheezing. Examination of the right-lower field reveals rales. Examination of the left-lower field reveals rales. Wheezing, rhonchi and rales present.  Musculoskeletal:     Cervical back: Normal range of motion.  Neurological:     Mental Status: She is alert.      Chest x-ray read by me does not show pneumonia  Assessment & Plan:   Problem List Items Addressed This Visit      Unprioritized   COVID-19 - Primary   Relevant Medications   azithromycin (ZITHROMAX Z-PAK) 250 MG tablet   Other Relevant Orders   DG Chest 2 View    Other Visit Diagnoses    Mild intermittent asthma with exacerbation       Pt with asthma  flare s/p Covid. Chest x-ray shows no pneumonia by my read.Rx for pred burst, Dulera, and Zpack to cover atypical   Relevant Medications   predniSONE (DELTASONE) 50 MG tablet   mometasone-formoterol (DULERA) 200-5 MCG/ACT AERO      Work note written to return to work next week.  To urgent care if worsening or no improvement in symptoms.  Demonstrates good inhaler technique  Follow up plan: Return in about 1 week (around 11/08/2020).

## 2020-11-03 ENCOUNTER — Other Ambulatory Visit: Payer: Self-pay | Admitting: Certified Nurse Midwife

## 2020-11-03 DIAGNOSIS — N393 Stress incontinence (female) (male): Secondary | ICD-10-CM

## 2020-11-03 NOTE — Progress Notes (Signed)
Referral to Pelvic Floor Physical Therapy due to stress incontinence per patient request, see orders.    Serafina Royals, CNM [facility], [facility] 11/03/20 11:58 AM

## 2020-11-08 ENCOUNTER — Other Ambulatory Visit: Payer: Self-pay

## 2020-11-08 ENCOUNTER — Ambulatory Visit (INDEPENDENT_AMBULATORY_CARE_PROVIDER_SITE_OTHER): Payer: BC Managed Care – PPO | Admitting: Unknown Physician Specialty

## 2020-11-08 VITALS — BP 118/72 | HR 76 | Temp 97.8°F | Ht 65.0 in | Wt 216.8 lb

## 2020-11-08 DIAGNOSIS — J189 Pneumonia, unspecified organism: Secondary | ICD-10-CM | POA: Diagnosis not present

## 2020-11-08 DIAGNOSIS — J4531 Mild persistent asthma with (acute) exacerbation: Secondary | ICD-10-CM | POA: Diagnosis not present

## 2020-11-08 DIAGNOSIS — J45909 Unspecified asthma, uncomplicated: Secondary | ICD-10-CM | POA: Insufficient documentation

## 2020-11-08 NOTE — Progress Notes (Signed)
BP 118/72   Pulse 76   Temp 97.8 F (36.6 C) (Temporal)   Ht 5\' 5"  (1.651 m)   Wt 216 lb 12.8 oz (98.3 kg)   SpO2 98%   BMI 36.08 kg/m    Subjective:    Patient ID: , female    DOB: 15-May-1991, 30 y.o.   MRN: [id number]  HPI: Karen Hanna is a 30 y.o. female  Chief Complaint  Patient presents with  . Follow-up    Been breathing on her own but some shortness of breath  . Cough    During some moments   Pt is here for f/u of asthma flare and possible atypical pneumonia.  Taking Dulera and Albuterol 4-5 times/day but didn't need it as much yesterday and hasn't needed it today yet.    Relevant past medical, surgical, family and social history reviewed and updated as indicated. Interim medical history since our last visit reviewed. Allergies and medications reviewed and updated.  Review of Systems  Per HPI unless specifically indicated above     Objective:    BP 118/72   Pulse 76   Temp 97.8 F (36.6 C) (Temporal)   Ht 5\' 5"  (1.651 m)   Wt 216 lb 12.8 oz (98.3 kg)   SpO2 98%   BMI 36.08 kg/m   Wt Readings from Last 3 Encounters:  11/08/20 216 lb 12.8 oz (98.3 kg)  11/01/20 219 lb (99.3 kg)  08/15/20 231 lb 1.6 oz (104.8 kg)    Physical Exam Constitutional:      General: She is not in acute distress.    Appearance: Normal appearance. She is well-developed and well-nourished.  HENT:     Head: Normocephalic and atraumatic.  Eyes:     General: Lids are normal. No scleral icterus.       Right eye: No discharge.        Left eye: No discharge.     Conjunctiva/sclera: Conjunctivae normal.  Neck:     Vascular: No carotid bruit or JVD.  Cardiovascular:     Rate and Rhythm: Normal rate and regular rhythm.     Heart sounds: Normal heart sounds.  Pulmonary:     Effort: Pulmonary effort is normal.     Breath sounds: Normal breath sounds.  Abdominal:     Palpations: There is no hepatomegaly or splenomegaly.  Musculoskeletal:        General: Normal  range of motion.     Cervical back: Normal range of motion and neck supple.  Skin:    General: Skin is warm, dry and intact.     Coloration: Skin is not pale.     Findings: No rash.  Neurological:     Mental Status: She is alert and oriented to person, place, and time.  Psychiatric:        Mood and Affect: Mood and affect normal.        Behavior: Behavior normal.        Thought Content: Thought content normal.        Judgment: Judgment normal.       Assessment & Plan:   Problem List Items Addressed This Visit      Unprioritized   Asthma    Continue with Dulera and Albuterol for 1 month.  Recheck in 1 month for step-down therapy discussion       Other Visit Diagnoses    Atypical pneumonia    -  Primary   Completed Z pack.  Pt ed on care for self for next 4 weeks.  OK to go to work next week       Follow up plan: Return in about 4 weeks (around 12/06/2020).

## 2020-11-08 NOTE — Assessment & Plan Note (Signed)
Continue with Dulera and Albuterol for 1 month.  Recheck in 1 month for step-down therapy discussion

## 2020-11-24 ENCOUNTER — Encounter: Payer: BC Managed Care – PPO | Admitting: Certified Nurse Midwife

## 2020-11-29 ENCOUNTER — Ambulatory Visit (INDEPENDENT_AMBULATORY_CARE_PROVIDER_SITE_OTHER): Payer: BC Managed Care – PPO | Admitting: Unknown Physician Specialty

## 2020-11-29 ENCOUNTER — Encounter: Payer: Self-pay | Admitting: Unknown Physician Specialty

## 2020-11-29 ENCOUNTER — Other Ambulatory Visit: Payer: Self-pay

## 2020-11-29 DIAGNOSIS — J4531 Mild persistent asthma with (acute) exacerbation: Secondary | ICD-10-CM

## 2020-11-29 DIAGNOSIS — O99019 Anemia complicating pregnancy, unspecified trimester: Secondary | ICD-10-CM

## 2020-11-29 NOTE — Assessment & Plan Note (Addendum)
Pt continues to use Dulera BID. Albuterol use has lessened and has not used at all in last week. Continue Dulera for now for prn use. If needed more than twice a day, use albuterol. Will re-evaluate in 3 months for possibility of step down in treatment.

## 2020-11-29 NOTE — Progress Notes (Addendum)
BP (!) 134/58 (BP Location: Right Arm, Patient Position: Sitting, Cuff Size: Normal)   Pulse 69   Temp (!) 97.1 F (36.2 C) (Temporal)   Ht 5\' 5"  (1.651 m)   Wt 233 lb (105.7 kg)   SpO2 99%   Breastfeeding Yes   BMI 38.77 kg/m    Subjective:    Patient ID: , female    DOB: 1990-11-29, 30 y.o.   MRN: [id number]  HPI: Karen Hanna is a 30 y.o. female  Chief Complaint  Patient presents with  . post Covid    Diagnose with COVID, secondary pneumonia x 1 mth ago. Pt state symptoms have improved. Denies any SOB or any other associated symptoms.    Asthma Pt states she is feeling much better. She states she has not had to use albuterol recently. She continues to use her [facility]. Triggers are the cold, dust, and seasonal allergies.   Anemia Previous Hgb 9.8 in October 2021 after childbirth. Currently taking Fe and asking for refill. Denies constipation and abnormal, tarry stool.   Relevant past medical, surgical, family and social history reviewed and updated as indicated. Interim medical history since our last visit reviewed. Allergies and medications reviewed and updated.  Review of Systems  Constitutional: Negative for fatigue and fever.  HENT: Negative for congestion.   Respiratory: Negative for cough, shortness of breath and wheezing.   Gastrointestinal: Negative for blood in stool and constipation.  Skin: Negative for pallor and rash.    Per HPI unless specifically indicated above     Objective:    BP (!) 134/58 (BP Location: Right Arm, Patient Position: Sitting, Cuff Size: Normal)   Pulse 69   Temp (!) 97.1 F (36.2 C) (Temporal)   Ht 5\' 5"  (1.651 m)   Wt 233 lb (105.7 kg)   SpO2 99%   Breastfeeding Yes   BMI 38.77 kg/m   Wt Readings from Last 3 Encounters:  11/29/20 233 lb (105.7 kg)  11/08/20 216 lb 12.8 oz (98.3 kg)  11/01/20 219 lb (99.3 kg)    Physical Exam Constitutional:      Appearance: Normal appearance.  HENT:     Head:  Normocephalic.     Nose: No rhinorrhea.  Cardiovascular:     Rate and Rhythm: Normal rate and regular rhythm.     Pulses: Normal pulses.     Heart sounds: Normal heart sounds.  Pulmonary:     Effort: Pulmonary effort is normal.     Breath sounds: Normal breath sounds.  Musculoskeletal:        General: Normal range of motion.  Skin:    General: Skin is warm and dry.  Neurological:     Mental Status: She is alert and oriented to person, place, and time.  Psychiatric:        Mood and Affect: Mood normal.        Behavior: Behavior normal.         Assessment & Plan:     Problem List Items Addressed This Visit      Respiratory   Asthma    Pt continues to use Dulera BID. Albuterol use has lessened and has not used at all in last week. Continue Dulera for now for prn use. If needed more than twice a day, use albuterol. Will re-evaluate in 3 months for possibility of step down in treatment.       Other Visit Diagnoses    Antepartum anemia  Hgb 9.8 after childbirth. Taking Ferrous sulfate 325 mg daily. D/C and begin multivitamin with Fe.       Follow up plan: Return in about 3 months (around 03/01/2021) for F/u for asthma control.

## 2020-12-12 ENCOUNTER — Ambulatory Visit (INDEPENDENT_AMBULATORY_CARE_PROVIDER_SITE_OTHER): Payer: BC Managed Care – PPO | Admitting: Certified Nurse Midwife

## 2020-12-12 ENCOUNTER — Other Ambulatory Visit: Payer: Self-pay

## 2020-12-12 ENCOUNTER — Encounter: Payer: Self-pay | Admitting: Certified Nurse Midwife

## 2020-12-12 VITALS — BP 117/80 | HR 66 | Ht 65.0 in | Wt 224.2 lb

## 2020-12-12 DIAGNOSIS — R42 Dizziness and giddiness: Secondary | ICD-10-CM

## 2020-12-12 DIAGNOSIS — R5383 Other fatigue: Secondary | ICD-10-CM | POA: Diagnosis not present

## 2020-12-12 DIAGNOSIS — Z01419 Encounter for gynecological examination (general) (routine) without abnormal findings: Secondary | ICD-10-CM | POA: Diagnosis not present

## 2020-12-12 DIAGNOSIS — Z862 Personal history of diseases of the blood and blood-forming organs and certain disorders involving the immune mechanism: Secondary | ICD-10-CM | POA: Diagnosis not present

## 2020-12-12 DIAGNOSIS — O9279 Other disorders of lactation: Secondary | ICD-10-CM | POA: Diagnosis not present

## 2020-12-12 NOTE — Patient Instructions (Signed)

## 2020-12-12 NOTE — Progress Notes (Signed)
ANNUAL PREVENTATIVE CARE GYN  ENCOUNTER NOTE  Subjective:       Karen Hanna is a 30 y.o. G60P1011 female here for a routine annual gynecologic exam.  Current complaints: 1.  Requests iron level; reports of dizziness and fatigue  2. Recurrent clogged milk ducts, not fever or chills noted.  3. Questions whether pelvic floor therapy referral is still good  Denies difficulty breathing, respiratory distress, chest pain, abdominal pain, excessive vaginal bleeding, and leg pain or swelling.  Gynecologic History  No LMP recorded. (Menstrual status: Lactating).   Contraception: condoms   Last Pap: 06/25/2019 Results were: normal  Obstetric History  OB History  Gravida Para Term Preterm AB Living  2 1 1   1 1   SAB IAB Ectopic Multiple Live Births        0 1    # Outcome Date GA Lbr Len/2nd Weight Sex Delivery Anes PTL Lv  2 Term 07/05/20 [redacted]w[redacted]d 11:10 / 00:45 3430 g F Vag-Spont EPI  LIV  1 AB 2011            Past Medical History:  Diagnosis Date  . Depression 06/25/2019    Past Surgical History:  Procedure Laterality Date  . APPENDECTOMY      Current Outpatient Medications on File Prior to Visit  Medication Sig Dispense Refill  . albuterol (VENTOLIN HFA) 108 (90 Base) MCG/ACT inhaler Inhale 2 puffs into the lungs every 6 (six) hours as needed for wheezing or shortness of breath. 8 g 0  . mometasone-formoterol (DULERA) 200-5 MCG/ACT AERO Inhale 2 puffs into the lungs in the morning and at bedtime. 1 each 1  . Prenatal Vit-Fe Fumarate-FA (PRENATAL VITAMINS PO) Take by mouth.     No current facility-administered medications on file prior to visit.    No Known Allergies  Social History   Socioeconomic History  . Marital status: Married    Spouse name: Not on file  . Number of children: Not on file  . Years of education: Not on file  . Highest education level: Not on file  Occupational History  . Not on file  Tobacco Use  . Smoking status: Former Smoker    Years:  11.00    Quit date: 01/28/2018    Years since quitting: 2.8  . Smokeless tobacco: Never Used  Vaping Use  . Vaping Use: Never used  Substance and Sexual Activity  . Alcohol use: Not Currently    Alcohol/week: 1.0 standard drink    Types: 1 Glasses of wine per week  . Drug use: Never  . Sexual activity: Yes    Birth control/protection: None, Other-see comments    Comment: breastfeeding  Other Topics Concern  . Not on file  Social History Narrative  . Not on file   Social Determinants of Health   Financial Resource Strain: Not on file  Food Insecurity: Not on file  Transportation Needs: Not on file  Physical Activity: Not on file  Stress: Not on file  Social Connections: Not on file  Intimate Partner Violence: Not on file    Family History  Problem Relation Age of Onset  . Depression Mother   . Hypertension Mother   . Alcoholism Father   . Mental illness Father   . Hypertension Brother   . Hypertension Maternal Grandfather   . Stroke Maternal Grandfather     The following portions of the patient's history were reviewed and updated as appropriate: allergies, current medications, past family history, past medical history,  past social history, past surgical history and problem list.  Review of Systems  ROS- Negative other than what was reported above. Information obtained verbally from patient.   Objective:   BP 117/80   Pulse 66   Ht 5\' 5"  (1.651 m)   Wt 101.7 kg   BMI 37.31 kg/m    CONSTITUTIONAL: Well-developed, well-nourished female in no acute distress.   PSYCHIATRIC: Normal mood and affect. Normal behavior. Normal judgment and thought content.  NEUROLGIC: Alert and oriented to person, place, and time.   EYES: Conjunctivae and EOM are normal.  NECK: Normal range of motion, supple, no masses.  Normal thyroid.   SKIN: Skin is warm and dry. No rash noted. Not diaphoretic. No erythema. No pallor.  CARDIOVASCULAR: Normal heart rate noted, regular rhythm,  no murmur.  RESPIRATORY: Clear to auscultation bilaterally. Effort and breath sounds normal, no problems with respiration noted.  BREASTS: Symmetric in size. No masses, skin changes, nipple drainage, or lymphadenopathy. Left breast- clogged duct noted, no redness, or warmth noted.  ABDOMEN: Soft, normal bowel sounds, no distention noted.  No tenderness, rebound or guarding.   PELVIC:  External Genitalia: Normal  BUS: Normal  Vagina: Normal  Cervix: Normal  Uterus: Normal   MUSCULOSKELETAL: Normal range of motion. No tenderness.  No cyanosis, clubbing, or edema.   Assessment:   Annual gynecologic examination 30 y.o.   Contraception: condoms   Obesity 2   Problem List Items Addressed This Visit   None   Visit Diagnoses    Encounter for well woman exam with routine gynecological exam    -  Primary   Relevant Orders   CBC   Ferritin   History of anemia       Relevant Orders   CBC   Ferritin   Clogged duct, postpartum       Other fatigue       Dizziness          Plan:   Pap: Not needed   Labs: CBC, ferritin, see orders  Routine preventative health maintenance measures emphasized: Exercise/Diet/Weight control, Tobacco Warnings, Alcohol/Substance use risks, Stress Management and Peer Pressure Issues; see AVS  Encouraged patient to make referral appointment with pelvic floor therapy, information given to patient.  Discussed mantinenous dosage of the sunflower lecithin and to continue home treatment measures  Reviewed red flag symptoms and when to call  RTC x 1 year for [facility] exam or sooner if needed   TRINITY MUSCATINE, Student-MidWife  [employer] 12/12/20 3:48 PM

## 2020-12-12 NOTE — Progress Notes (Signed)
I have seen, interviewed, and examined the patient in conjunction with the [employer] and affirm the diagnosis and management plan.   Gunnar Bulla, CNM [facility], [facility] 12/12/20 4:21 PM

## 2020-12-12 NOTE — Progress Notes (Signed)
Pt present for annual exam. Pt stated having left breast clogged milk ducts no other issues.

## 2020-12-13 LAB — FERRITIN: Ferritin: 62 ng/mL (ref 15–150)

## 2020-12-13 LAB — CBC
Hematocrit: 42.6 % (ref 34.0–46.6)
Hemoglobin: 14.2 g/dL (ref 11.1–15.9)
MCH: 29.5 pg (ref 26.6–33.0)
MCHC: 33.3 g/dL (ref 31.5–35.7)
MCV: 88 fL (ref 79–97)
Platelets: 226 10*3/uL (ref 150–450)
RBC: 4.82 x10E6/uL (ref 3.77–5.28)
RDW: 13.1 % (ref 11.7–15.4)
WBC: 8 10*3/uL (ref 3.4–10.8)

## 2020-12-29 ENCOUNTER — Ambulatory Visit: Payer: BC Managed Care – PPO | Admitting: Physician Assistant

## 2021-01-04 ENCOUNTER — Ambulatory Visit (INDEPENDENT_AMBULATORY_CARE_PROVIDER_SITE_OTHER): Payer: BC Managed Care – PPO | Admitting: Family Medicine

## 2021-01-04 ENCOUNTER — Encounter: Payer: Self-pay | Admitting: Family Medicine

## 2021-01-04 ENCOUNTER — Other Ambulatory Visit: Payer: Self-pay

## 2021-01-04 VITALS — BP 113/67 | HR 71 | Ht 65.0 in | Wt 215.2 lb

## 2021-01-04 DIAGNOSIS — G5601 Carpal tunnel syndrome, right upper limb: Secondary | ICD-10-CM

## 2021-01-04 DIAGNOSIS — R202 Paresthesia of skin: Secondary | ICD-10-CM

## 2021-01-04 DIAGNOSIS — M5442 Lumbago with sciatica, left side: Secondary | ICD-10-CM

## 2021-01-04 MED ORDER — CYCLOBENZAPRINE HCL 10 MG PO TABS: 5.0000 mg | ORAL_TABLET | Freq: Three times a day (TID) | ORAL | 2 refills | Status: DC | PRN

## 2021-01-04 NOTE — Progress Notes (Signed)
Subjective:    Patient ID: Karen Hanna, female    DOB: [birth year], 30 y.o.   MRN: [id number]  Karen Hanna is a 30 y.o. female presenting on 01/04/2021 for Numbness and Extremity Weakness  Previous PCP Wilhelmina Mcardle, AGPCNP-BC   HPI   Right Upper Extremity Paresthesia Right hand dominant She can also use left hand for some activities Reports prior history of Right handed numbness and weakness since high school in past, has coped with it for years. She was not formally diagnosed with any issue in particular. She describes symptoms will be present constantly throughout the day with some soreness and pain and cramping. Can flare up and get worse with frequent writing or typing or lifting. Playing video games affects her thumbs. Describes that occasionally when hand cramping at times she would have to push her fingers back. Family history similar with mother as well for same problem. Issue with a nerve pinched in her mother's neck. - Also admits some reproduced symptoms with paresthesia even with compression with blood pressure cuff, pins and needles - Localized to R thumb, index finger and 4th finger mostly. - Tried wrist brace for carpal tunnel Admits fingertip numbness all 5 fingers. Denies redness or rash or injury    Depression screen [facility] 2/9 08/15/2020 07/18/2020 06/27/2020  Decreased Interest 1 0 2  Down, Depressed, Hopeless 1 1 2   PHQ - 2 Score 2 1 4   Altered sleeping 0 1 2  Tired, decreased energy 1 0 3  Change in appetite 2 0 1  Feeling bad or failure about yourself  2 1 1   Trouble concentrating 0 0 1  Moving slowly or fidgety/restless 1 1 1   Suicidal thoughts 0 0 1  PHQ-9 Score 8 4 14   Difficult doing work/chores Somewhat difficult - Somewhat difficult  Some recent data might be hidden    Social History   Tobacco Use  . Smoking status: Former Smoker    Years: 11.00    Quit date: 01/28/2018    Years since quitting: 2.9  . Smokeless tobacco: Never Used  Vaping  Use  . Vaping Use: Never used  Substance Use Topics  . Alcohol use: Not Currently    Alcohol/week: 1.0 standard drink    Types: 1 Glasses of wine per week  . Drug use: Never    Review of Systems Per HPI unless specifically indicated above     Objective:    BP 113/67   Pulse 71   Ht 5\' 5"  (1.651 m)   Wt 215 lb 3.2 oz (97.6 kg)   SpO2 99%   BMI 35.81 kg/m   Wt Readings from Last 3 Encounters:  01/04/21 215 lb 3.2 oz (97.6 kg)  12/12/20 224 lb 3.2 oz (101.7 kg)  11/29/20 233 lb (105.7 kg)    Physical Exam Vitals and nursing note reviewed.  Constitutional:      General: She is not in acute distress.    Appearance: She is well-developed. She is not diaphoretic.     Comments: Well-appearing, comfortable, cooperative  HENT:     Head: Normocephalic and atraumatic.  Eyes:     General:        Right eye: No discharge.        Left eye: No discharge.     Conjunctiva/sclera: Conjunctivae normal.  Neck:     Comments: Neck Spurling's maneuver some reproduced discomfort R sided without paresthesia reproduced Cardiovascular:     Rate and Rhythm: Normal rate.  Pulmonary:  Effort: Pulmonary effort is normal.  Musculoskeletal:     Cervical back: Normal range of motion and neck supple. No tenderness.     Comments: Right Hand/Wrist Inspection: Normal appearance, symmetrical, no bulky MCP joints, no edema or erythema. Palpation: Non tender hand / wrist, carpal bones, including MCP, base of thumb. No distinct anatomical snuff box or scaphoid tenderness. ROM: full active wrist ROM flex / ext, ulnar / radial deviation, pain with radial deviation Special Testing:  Finkelstein's test positive with pain and paresthesia. No reproduced tinel's Strength: 5/5 grip, thumb opposition, wrist flex/ext Neurovascular: distally intact  Skin:    General: Skin is warm and dry.     Findings: No erythema or rash.  Neurological:     Mental Status: She is alert and oriented to person, place, and time.   Psychiatric:        Behavior: Behavior normal.     Comments: Well groomed, good eye contact, normal speech and thoughts    Results for orders placed or performed in visit on 12/12/20  CBC  Result Value Ref Range   WBC 8.0 3.4 - 10.8 x10E3/uL   RBC 4.82 3.77 - 5.28 x10E6/uL   Hemoglobin 14.2 11.1 - 15.9 g/dL   Hematocrit 57.3 22.0 - 46.6 %   MCV 88 79 - 97 fL   MCH 29.5 26.6 - 33.0 pg   MCHC 33.3 31.5 - 35.7 g/dL   RDW 25.4 27.0 - 62.3 %   Platelets 226 150 - 450 x10E3/uL  Ferritin  Result Value Ref Range   Ferritin 62 15 - 150 ng/mL      Assessment & Plan:   Problem List Items Addressed This Visit   None   Visit Diagnoses    Paresthesia of right upper extremity    -  Primary   Relevant Medications   cyclobenzaprine (FLEXERIL) 10 MG tablet   Other Relevant Orders   Ambulatory referral to Neurology   Right carpal tunnel syndrome       Relevant Medications   cyclobenzaprine (FLEXERIL) 10 MG tablet   Other Relevant Orders   Ambulatory referral to Neurology   Acute left-sided low back pain with left-sided sciatica       Relevant Medications   cyclobenzaprine (FLEXERIL) 10 MG tablet   Other Relevant Orders   Ambulatory referral to Neurology      Referral to [facility] for further work up  chronic >10+ years paresthesia and neuropathy with associated pain and weakness primarily Right hand and some higher upper extremity as well, all 5 fingers, more consistent with nerve entrapment or pinching in upper ext, possible carpal tunnel as most symptoms provoked by repetitive hand/wrist activity and reproduced symptoms are classic for carpal tunnel but also involves 4th 5th fingers as well. No prior clear diagnosis or treatment, now presenting to medical care. Request nerve conduction study evaluation and treatment. Also she has low back pain with similar sciatica vs paresthesia on L side.    Trial on Flexeril 5-10mg  TID PRN caution sedation, likely just use QHS Can consider  add or swap to Gabapentin if interested. May use wrist splint, limit repetitive activity.  Orders Placed This Encounter  Procedures  . Ambulatory referral to Neurology    Referral Priority:   Routine    Referral Type:   Consultation    Referral Reason:   Specialty Services Required    Requested Specialty:   Neurology    Number of Visits Requested:   1  Meds ordered this encounter  Medications  . cyclobenzaprine (FLEXERIL) 10 MG tablet    Sig: Take 0.5-1 tablets (5-10 mg total) by mouth 3 (three) times daily as needed for muscle spasms.    Dispense:  30 tablet    Refill:  2    Follow up plan: Return if symptoms worsen or fail to improve, for carpal tunnel / follow up from neurologist.   Saralyn Pilar, DO [facility] 01/04/2021, 9:06 AM

## 2021-01-04 NOTE — Patient Instructions (Addendum)
Thank you for coming to the office today.  [facility] - Neurology Dept [address] [place], [place] [address] Phone: [phone]  Stay tuned for apt.  Start Cyclobenzapine (Flexeril) 10mg  tablets (muscle relaxant) - start with half (cut) to one whole pill at night for muscle relaxant - may make you sedated or sleepy (be careful driving or working on this) if tolerated you can take half to whole tab 2 to 3 times daily or every 8 hours as needed  Let me know if need gabapentin other nerve medication  Please schedule a Follow-up Appointment to: Return if symptoms worsen or fail to improve, for carpal tunnel / follow up from neurologist.  If you have any other questions or concerns, please feel free to call the office or send a message through MyChart. You may also schedule an earlier appointment if necessary.  Additionally, you may be receiving a survey about your experience at our office within a few days to 1 week by e-mail or mail. We value your feedback.  , DO [facility], [facility]

## 2021-01-16 ENCOUNTER — Ambulatory Visit: Payer: BC Managed Care – PPO | Admitting: Physical Therapy

## 2021-01-18 ENCOUNTER — Encounter: Payer: Self-pay | Admitting: Physical Therapy

## 2021-01-18 ENCOUNTER — Ambulatory Visit: Payer: BC Managed Care – PPO | Attending: Certified Nurse Midwife | Admitting: Physical Therapy

## 2021-01-18 ENCOUNTER — Other Ambulatory Visit: Payer: Self-pay

## 2021-01-18 DIAGNOSIS — M6281 Muscle weakness (generalized): Secondary | ICD-10-CM | POA: Diagnosis present

## 2021-01-18 DIAGNOSIS — R293 Abnormal posture: Secondary | ICD-10-CM | POA: Diagnosis present

## 2021-01-18 DIAGNOSIS — R278 Other lack of coordination: Secondary | ICD-10-CM | POA: Insufficient documentation

## 2021-01-18 NOTE — Therapy (Signed)
[facility] [address] [place], [place], [address] Phone: [phone]   Fax:  [phone]  Physical Therapy Evaluation  Patient Details  Name: Karen Hanna MRN: [id number] Date of Birth: 02-18-1991 Referring Provider (PT): Jeralyn Bennett   Encounter Date: 01/18/2021   PT End of Session - 01/18/21 1509    Visit Number 1    Number of Visits 8    Date for PT Re-Evaluation 03/15/21    PT Start Time 1415    PT Stop Time 1500    PT Time Calculation (min) 45 min    Activity Tolerance Patient tolerated treatment well    Behavior During Therapy [facility] for tasks assessed/performed           Past Medical History:  Diagnosis Date  . Depression 06/25/2019    Past Surgical History:  Procedure Laterality Date  . APPENDECTOMY      There were no vitals filed for this visit.        [facility] PT Assessment - 01/18/21 1415      Assessment   Medical Diagnosis Postpartum Care    Referring Provider (PT) Lawhorn    Hand Dominance Right    Prior Therapy Yes; prepartum      Balance Screen   Has the patient fallen in the past 6 months No            PELVIC HEALTH PHYSICAL THERAPY EVALUATION  SCREENING Red Flags: None Have you had any night sweats? Unexplained weight loss? Saddle anesthesia? Unexplained changes in bowel or bladder habits?  Precautions: Breastfeeding  SUBJECTIVE  Chief Complaint: Patient notes that when she had Covid in February she experienced increased UI with coughing and sneezing. Patient notes that this has improved somewhat, but still has mild leakage with coughing/sneezing. Patient notes "a little" leakage with urgency. Patient notes she has a hard time gauging when to pee; she doesn't get the urge until it is too late and hurting. Patient notes right now her sleep schedule is off; she notes that she does empty when she wakes up with the baby.   Pertinent History:  Falls Negative.  Scoliosis Negative. Pulmonary  disease/dysfunction Negative. Surgical history: Negative.   Obstetrical History: G2P1 Deliveries: vaginal Tearing/Episiotomy: grade 2 Labored for 10+ hours; short pushing.  Gynecological History: Hysterectomy: Yes  Endometriosis: Negative Pelvic Organ Prolapse: Negative Pain with exam: Yes Heaviness/pressure: No    Urinary History: Incontinence: Positive. Onset: 10 years; increased since delivery Triggers: sneezing, coughing, urgency, exercising (cardio) Amount: Min/Mod.  Protective undergarments: Yes  Type: pantlyiners  Number used/day: 2-4 Fluid Intake: 1+ gallon H20, 16 oz caffeinated, occasional/rare juices/sodas Nocturia: 1x/night Frequency of urination: every 3-5 hours Pain with urination: Positive for after prolonged storing Difficulty initiating urination: Positive for after intercourse and pumping. Intermittent stream: Negative Frequent UTI: Negative.   Gastrointestinal History: [place] Stool Chart: Type 4 Frequency of BMs: 1x/day Pain with defecation: Positive for discomfort. Straining with defecation: Negative Hemorrhoids: Negative Toileting posture: feet flat on floor Incontinence: Positive for smearing.   Sexual activity/pain: Pain with intercourse: Positive.   Initial penetration: Yes  Deep thrusting: Yes External stimulation: Yes Able to achieve orgasm: Yes  Location of pain: suprapubic area Current pain:  1/10  Max pain:  7-8/10 Least pain: 0 /10 Pain quality: pain quality: burning, pressure and discomfort, tearing, scraping Radiating pain: No   Current activities:  Video games, writing, walking, getting back to the gym  Patient Goals:  "be able to cough and  sneeze"; "I would love for my hips to not hurt"   OBJECTIVE  Mental Status Patient is oriented to person, place and time.  Recent memory is intact.  Remote memory is intact.  Attention span and concentration are intact.  Expressive speech is intact.  Patient's fund of knowledge is  within normal limits for educational level.  POSTURE/OBSERVATIONS:  Lumbar lordosis: diminished Thoracic kyphosis: increased  Iliac crest height: appearing equal bilaterally Patient sits with rounded shoulders and slumped posture.  GAIT: Grossly WFL.  RANGE OF MOTION: deferred 2/2 to time constraints   LEFT RIGHT  Lumbar forward flexion (65):      Lumbar extension (30):     Lumbar lateral flexion (25):     Thoracic and Lumbar rotation (30 degrees):       Hip Flexion (0-125):      Hip IR (0-45):     Hip ER (0-45):     Hip Abduction (0-40):     Hip extension (0-15):       STRENGTH: MMT deferred 2/2 to time constraints  RLE LLE  Hip Flexion    Hip Extension    Hip Abduction     Hip Adduction     Hip ER     Hip IR     Knee Extension    Knee Flexion    Dorsiflexion     Plantarflexion (seated)     ABDOMINAL: deferred 2/2 to time constraints Palpation: Diastasis: Scar mobility: Rib flare:  SPECIAL TESTS: deferred 2/2 to time constraints   PHYSICAL PERFORMANCE MEASURES: STS: WFL  EXTERNAL PELVIC EXAM: deferred 2/2 to time constraints Palpation: Breath coordination: Cued Lengthen: Cued Contraction: Cough:  INTERNAL VAGINAL EXAM: deferred 2/2 to time constraints Introitus Appears:  Skin integrity:  Scar mobility: Strength (PERF):  Symmetry: Palpation: Prolapse:   INTERNAL RECTAL EXAM: not indicated Strength (PERF): Symmetry: Palpation: Prolapse:   OUTCOME MEASURES: FOTO (5)   ASSESSMENT Patient is a 30 year old presenting to clinic with chief complaints of urinary incontinence and suprapubic pain. Upon examination, patient demonstrates deficits in PFM coordination, PFM strength, IAP management, pain, and posture as evidenced by flexed posture with increased shortening of anterior chain, UI with coughing/sneezing, UI with urgency, 7-8/10 pain with penetration. Patient's responses on FOTO outcome measures (57) indicate moderate functional  limitations/disability/distress. Patient's progress may be limited due to persistence of symptoms; however, patient's motivation is advantageous. Patient was able to achieve basic understanding of PFM functions during today's evaluation and responded positively to educational interventions. Patient will benefit from continued skilled therapeutic intervention to address deficits in PFM coordination, PFM strength, IAP management, pain, and posture in order to increase function and improve overall QOL.  EDUCATION Patient educated on prognosis, POC, and provided with HEP including: bladder diary. Patient articulated understanding and returned demonstration. Patient will benefit from further education in order to maximize compliance and understanding for long-term therapeutic gains.  TREATMENT Neuromuscular Re-education: Patient educated on primary functions of the pelvic floor including: posture/balance, sexual pleasure, storage and elimination of waste from the body, abdominal cavity closure, and breath coordination.    Objective measurements completed on examination: See above findings.         PT Long Term Goals - 01/18/21 1512      PT LONG TERM GOAL #1   Title Patient will demonstrate independence with HEP in order to maximize therapeutic gains and improve carryover from physical therapy sessions to ADLs in the home and community.    Baseline IE: not initiated  Time 8    Period Weeks    Status New    Target Date 03/15/21      PT LONG TERM GOAL #2   Title Patient will demonstrate improved function as evidenced by a score of 66 on FOTO Urinary measure for full participation in activities at home and in the community.    Baseline IE: 57    Time 8    Period Weeks    Status New    Target Date 03/15/21      PT LONG TERM GOAL #3   Title Patient will demonstrate circumferential and sequential contraction of >4/5 MMT, > 5 sec hold x6 and 5 consecutive quick flicks with </= 10 min rest  between testing bouts, and relaxation of the PFM coordinated with breath for improved management of intra-abdominal pressure and normal bowel and bladder function without the presence of pain nor incontinence in order to improve participation at home and in the community.    Baseline IE: not demonstrated    Time 8    Period Weeks    Status New    Target Date 03/15/21      PT LONG TERM GOAL #4   Title Patient will demonstrate improved PFM coordination and motor control as evidenced by decreased reliance on protective undergarments (<1-2/day) for improved participation at home and in the community.    Baseline IE: 2-4    Time 8    Period Weeks    Status New    Target Date 03/15/21      PT LONG TERM GOAL #5   Title Patient will decrease worst pain as reported on NPRS by at least 2 points to demonstrate clinically significant reduction in pain in order to restore/improve function and overall QOL.    Baseline IE: 7-8/10 (penetration)    Time 8    Period Weeks    Status New    Target Date 03/15/21                  Plan - 01/18/21 1510    Clinical Impression Statement Patient is a [age] year old presenting to clinic with chief complaints of urinary incontinence and suprapubic pain. Upon examination, patient demonstrates deficits in PFM coordination, PFM strength, IAP management, pain, and posture as evidenced by flexed posture with increased shortening of anterior chain, UI with coughing/sneezing, UI with urgency, 7-8/10 pain with penetration. Patient's responses on FOTO outcome measures (57) indicate moderate functional limitations/disability/distress. Patient's progress may be limited due to persistence of symptoms; however, patient's motivation is advantageous. Patient was able to achieve basic understanding of PFM functions during today's evaluation and responded positively to educational interventions. Patient will benefit from continued skilled therapeutic intervention to address  deficits in PFM coordination, PFM strength, IAP management, pain, and posture in order to increase function and improve overall QOL.    Personal Factors and Comorbidities Behavior Pattern;Comorbidity 3+;Fitness;Time since onset of injury/illness/exacerbation;Past/Current Experience    Comorbidities asthma, anxiety, depression    Examination-Activity Limitations Transfers;Sleep;Continence;Caring for Others;Locomotion Level;Other    Examination-Participation Restrictions Interpersonal Relationship;Meal Prep;Shop;Laundry;Cleaning;Community Activity    Stability/Clinical Decision Making Evolving/Moderate complexity    Clinical Decision Making Moderate    Rehab Potential Good    PT Frequency 1x / week    PT Duration 8 weeks    PT Treatment/Interventions ADLs/Self Care Home Management;Biofeedback;Cryotherapy;Electrical Stimulation;Moist Heat;Therapeutic exercise;Neuromuscular re-education;Patient/family education;Orthotic Fit/Training;Manual techniques;Scar mobilization;Dry needling;Taping;Spinal Manipulations;Joint Manipulations    PT Next Visit Plan physical assessment    PT Home Exercise Plan bladder  diary    Consulted and Agree with Plan of Care Patient           Patient will benefit from skilled therapeutic intervention in order to improve the following deficits and impairments:  Decreased endurance,Improper body mechanics,Postural dysfunction,Pain,Decreased coordination,Decreased strength,Decreased scar mobility  Visit Diagnosis: Other lack of coordination  Muscle weakness (generalized)     Problem List Patient Active Problem List   Diagnosis Date Noted  . Asthma 11/08/2020  . COVID-19 10/26/2020  . History of premature rupture of membranes 07/04/2020  . History of depression 01/20/2020  . Anxiety 11/18/2019    Class: Chronic  . Depression 06/25/2019   Sheria Lang PT, DPT [id number]  01/18/2021, 3:20 PM  [facility] [address] [place], [place], [address] Phone: [phone]   Fax:  [phone]  Name: Karen Hanna MRN: [id number] Date of Birth: 03-31-1991

## 2021-01-23 ENCOUNTER — Encounter: Payer: Self-pay | Admitting: Physical Therapy

## 2021-01-23 ENCOUNTER — Ambulatory Visit: Payer: BC Managed Care – PPO | Admitting: Physical Therapy

## 2021-01-23 ENCOUNTER — Other Ambulatory Visit: Payer: Self-pay

## 2021-01-23 DIAGNOSIS — R278 Other lack of coordination: Secondary | ICD-10-CM | POA: Diagnosis not present

## 2021-01-23 DIAGNOSIS — M6281 Muscle weakness (generalized): Secondary | ICD-10-CM

## 2021-01-23 DIAGNOSIS — R293 Abnormal posture: Secondary | ICD-10-CM

## 2021-01-23 NOTE — Therapy (Signed)
[facility] [address] [place], [place], [address] Phone: [phone]   Fax:  [phone]  Physical Therapy Treatment  Patient Details  Name: Karen Hanna MRN: [id number] Date of Birth: 02/28/1991 Referring Provider (PT): Jeralyn Bennett   Encounter Date: 01/23/2021   PT End of Session - 01/23/21 1802    Visit Number 2    Number of Visits 8    Date for PT Re-Evaluation 03/15/21    PT Start Time 1755    PT Stop Time 1835    PT Time Calculation (min) 40 min    Activity Tolerance Patient tolerated treatment well    Behavior During Therapy [facility] for tasks assessed/performed           Past Medical History:  Diagnosis Date  . Depression 06/25/2019    Past Surgical History:  Procedure Laterality Date  . APPENDECTOMY      There were no vitals filed for this visit.   Subjective Assessment - 01/23/21 1759    Subjective Patient notes that she has not had any significant changes since her evaluation. Patient does note that she has tried to be more mindful urination and is practicing timed voiding with good success.    Currently in Pain? No/denies           TREATMENT  Pre-treatment assessment: RANGE OF MOTION:    LEFT RIGHT  Lumbar forward flexion (65):  WNL    Lumbar extension (30): WNL    Lumbar lateral flexion (25):  WNL WNL  Thoracic and Lumbar rotation (30 degrees):    WNL WNL  Hip Flexion (0-125):   WNL WNL  Hip IR (0-45):  WNL WNL  Hip ER (0-45):  WNL WNL  Hip Abduction (0-40):  WNL WNL  Hip extension (0-15):  WNL WNL    SENSATION: Grossly intact to light touch bilateral LEs as determined by testing dermatomes L2-S2 Proprioception and hot/cold testing deferred on this date  STRENGTH: MMT   RLE LLE  Hip Flexion 4 4  Hip Extension 5 5  Hip Abduction  5 5  Hip Adduction  5 5  Hip ER  5 5  Hip IR  5 5  Knee Extension 5 5  Knee Flexion 5 5  Dorsiflexion  5 5  Plantarflexion (seated) 5 5   ABDOMINAL:   Palpation: TTP throughout, no palpable masses; some diffuse muscle tension throughout UQ Diastasis: present below umbilicus 3 finger width; umbilicus and superior < 2 finger width Scar mobility: n/a Rib flare: present B Neuromuscular Re-education: Supine hooklying diaphragmatic breathing with VCs and TCs for downregulation of the nervous system and improved management of IAP Sidelying, thoracolumbar rotations (open-book) bilaterally with diaphragmatic breathing for improved diaphragmatic and rib cage excursion. VCs and TCs to prevent compensations. Supine hooklying, TrA activation with exhalation. VCs and TCs to decrease compensatory patterns and minimize aggravation of the lumbar paraspinals. Supine hooklying, TrA activation with gentle compression for DRAM correction. Patient education on current activity modifications/precautions.  Patient educated throughout session on appropriate technique and form using multi-modal cueing, HEP, and activity modification. Patient articulated understanding and returned demonstration.  Patient Response to interventions: Comfortable to return in 1 week.  ASSESSMENT Patient presents to clinic with excellent motivation to participate in therapy. Patient demonstrates deficits in PFM coordination, PFM strength, IAP management, pain, and posture. Patient able to achieve TrA coordinated and sequential activation ~50% consistency during today's session and responded positively to deep core interventions. Patient will benefit from continued  skilled therapeutic intervention to address remaining deficits in PFM coordination, PFM strength, IAP management, pain, and posture in order to increase function and improve overall QOL.      PT Long Term Goals - 01/18/21 1512      PT LONG TERM GOAL #1   Title Patient will demonstrate independence with HEP in order to maximize therapeutic gains and improve carryover from physical therapy sessions to ADLs in the home and  community.    Baseline IE: not initiated    Time 8    Period Weeks    Status New    Target Date 03/15/21      PT LONG TERM GOAL #2   Title Patient will demonstrate improved function as evidenced by a score of 66 on FOTO Urinary measure for full participation in activities at home and in the community.    Baseline IE: 57    Time 8    Period Weeks    Status New    Target Date 03/15/21      PT LONG TERM GOAL #3   Title Patient will demonstrate circumferential and sequential contraction of >4/5 MMT, > 5 sec hold x6 and 5 consecutive quick flicks with </= 10 min rest between testing bouts, and relaxation of the PFM coordinated with breath for improved management of intra-abdominal pressure and normal bowel and bladder function without the presence of pain nor incontinence in order to improve participation at home and in the community.    Baseline IE: not demonstrated    Time 8    Period Weeks    Status New    Target Date 03/15/21      PT LONG TERM GOAL #4   Title Patient will demonstrate improved PFM coordination and motor control as evidenced by decreased reliance on protective undergarments (<1-2/day) for improved participation at home and in the community.    Baseline IE: 2-4    Time 8    Period Weeks    Status New    Target Date 03/15/21      PT LONG TERM GOAL #5   Title Patient will decrease worst pain as reported on NPRS by at least 2 points to demonstrate clinically significant reduction in pain in order to restore/improve function and overall QOL.    Baseline IE: 7-8/10 (penetration)    Time 8    Period Weeks    Status New    Target Date 03/15/21                 Plan - 01/23/21 1802    Clinical Impression Statement Patient presents to clinic with excellent motivation to participate in therapy. Patient demonstrates deficits in PFM coordination, PFM strength, IAP management, pain, and posture. Patient able to achieve TrA coordinated and sequential activation ~50%  consistency during today's session and responded positively to deep core interventions. Patient will benefit from continued skilled therapeutic intervention to address remaining deficits in PFM coordination, PFM strength, IAP management, pain, and posture in order to increase function and improve overall QOL.    Personal Factors and Comorbidities Behavior Pattern;Comorbidity 3+;Fitness;Time since onset of injury/illness/exacerbation;Past/Current Experience    Comorbidities asthma, anxiety, depression    Examination-Activity Limitations Transfers;Sleep;Continence;Caring for Others;Locomotion Level;Other    Examination-Participation Restrictions Interpersonal Relationship;Meal Prep;Shop;Laundry;Cleaning;Community Activity    Stability/Clinical Decision Making Evolving/Moderate complexity    Rehab Potential Good    PT Frequency 1x / week    PT Duration 8 weeks    PT Treatment/Interventions ADLs/Self Care Home Management;Biofeedback;Cryotherapy;Electrical Stimulation;Moist Heat;Therapeutic  exercise;Neuromuscular re-education;Patient/family education;Orthotic Fit/Training;Manual techniques;Scar mobilization;Dry needling;Taping;Spinal Manipulations;Joint Manipulations    PT Home Exercise Plan bladder diary    Consulted and Agree with Plan of Care Patient           Patient will benefit from skilled therapeutic intervention in order to improve the following deficits and impairments:  Decreased endurance,Improper body mechanics,Postural dysfunction,Pain,Decreased coordination,Decreased strength,Decreased scar mobility  Visit Diagnosis: Other lack of coordination  Muscle weakness (generalized)  Abnormal posture     Problem List Patient Active Problem List   Diagnosis Date Noted  . Asthma 11/08/2020  . COVID-19 10/26/2020  . History of premature rupture of membranes 07/04/2020  . History of depression 01/20/2020  . Anxiety 11/18/2019    Class: Chronic  . Depression 06/25/2019   Sheria Lang PT, DPT [phone]  01/23/2021, 6:45 PM  [place] [facility] [address] [place], [place], [address] Phone: [phone]   Fax:  [phone]  Name: TELA Hanna MRN: [id number] Date of Birth: October 07, 1990

## 2021-01-30 ENCOUNTER — Other Ambulatory Visit: Payer: Self-pay

## 2021-01-30 ENCOUNTER — Encounter: Payer: Self-pay | Admitting: Physical Therapy

## 2021-01-30 ENCOUNTER — Ambulatory Visit: Payer: BC Managed Care – PPO | Attending: Certified Nurse Midwife | Admitting: Physical Therapy

## 2021-01-30 DIAGNOSIS — R278 Other lack of coordination: Secondary | ICD-10-CM | POA: Diagnosis present

## 2021-01-30 DIAGNOSIS — M6281 Muscle weakness (generalized): Secondary | ICD-10-CM | POA: Insufficient documentation

## 2021-01-30 DIAGNOSIS — R293 Abnormal posture: Secondary | ICD-10-CM | POA: Diagnosis present

## 2021-01-30 NOTE — Therapy (Signed)
[facility] [address]. [place], [place], [address] Phone: [phone]   Fax:  [phone]  Physical Therapy Treatment  Patient Details  Name: Karen Hanna MRN: [id number] Date of Birth: September 17, 1991 Referring Provider (PT): Jeralyn Bennett   Encounter Date: 01/30/2021   PT End of Session - 01/30/21 1744    Visit Number 3    Number of Visits 8    Date for PT Re-Evaluation 03/15/21    PT Start Time 1745    PT Stop Time 1825    PT Time Calculation (min) 40 min    Activity Tolerance Patient tolerated treatment well    Behavior During Therapy [facility] for tasks assessed/performed           Past Medical History:  Diagnosis Date  . Depression 06/25/2019    Past Surgical History:  Procedure Laterality Date  . APPENDECTOMY      There were no vitals filed for this visit.   Subjective Assessment - 01/30/21 1751    Subjective Patient reports that she hurt her back on Wednesday at work. Patient was holding a bag when someone put a substantial weight quickly into the bag which caused her to bend quickly forward. Patient notes pain has calmed down some since. Patient notes that she had UI one time since last visit.    Currently in Pain? Yes    Pain Score 7     Pain Location Back    Pain Orientation Lower    Pain Descriptors / Indicators Other (Comment)   pulling             TREATMENT  Neuromuscular Re-education: Supine hooklying diaphragmatic breathing with VCs and TCs for downregulation of the nervous system and improved management of IAP Supine hooklying trunk rotations bilaterally with diaphragmatic breathing for improved diaphragmatic and rib cage excursion. VCs and TCs to prevent compensations. Supine hooklying, TrA activation with exhalation. VCs and TCs to decrease compensatory patterns and minimize aggravation of the lumbar paraspinals. Standing QL stretch in doorway for decreased muscle spasm/pain and improved posture  Manual  Therapy: STM and TPR performed to B gluteal and lumbar paraspinal mm to allow for decreased tension and pain and improved posture and function   Patient educated throughout session on appropriate technique and form using multi-modal cueing, HEP, and activity modification. Patient articulated understanding and returned demonstration.  Patient Response to interventions: Comfortable to return in 1 week.  ASSESSMENT Patient presents to clinic with excellent motivation to participate in therapy. Patient demonstrates deficits in PFM coordination, PFM strength, IAP management, pain, and posture. Patient with improved coordination of TrA in supine hooklying but continues to have compensatory recruitment from obliques during today's session and responded positively to quadratus lumborum mm interventions. Patient will benefit from continued skilled therapeutic intervention to address remaining deficits in PFM coordination, PFM strength, IAP management, pain, and posture in order to increase function and improve overall QOL.     PT Long Term Goals - 01/18/21 1512      PT LONG TERM GOAL #1   Title Patient will demonstrate independence with HEP in order to maximize therapeutic gains and improve carryover from physical therapy sessions to ADLs in the home and community.    Baseline IE: not initiated    Time 8    Period Weeks    Status New    Target Date 03/15/21      PT LONG TERM GOAL #2   Title Patient will demonstrate improved function as  evidenced by a score of 66 on FOTO Urinary measure for full participation in activities at home and in the community.    Baseline IE: 57    Time 8    Period Weeks    Status New    Target Date 03/15/21      PT LONG TERM GOAL #3   Title Patient will demonstrate circumferential and sequential contraction of >4/5 MMT, > 5 sec hold x6 and 5 consecutive quick flicks with </= 10 min rest between testing bouts, and relaxation of the PFM coordinated with breath for  improved management of intra-abdominal pressure and normal bowel and bladder function without the presence of pain nor incontinence in order to improve participation at home and in the community.    Baseline IE: not demonstrated    Time 8    Period Weeks    Status New    Target Date 03/15/21      PT LONG TERM GOAL #4   Title Patient will demonstrate improved PFM coordination and motor control as evidenced by decreased reliance on protective undergarments (<1-2/day) for improved participation at home and in the community.    Baseline IE: 2-4    Time 8    Period Weeks    Status New    Target Date 03/15/21      PT LONG TERM GOAL #5   Title Patient will decrease worst pain as reported on NPRS by at least 2 points to demonstrate clinically significant reduction in pain in order to restore/improve function and overall QOL.    Baseline IE: 7-8/10 (penetration)    Time 8    Period Weeks    Status New    Target Date 03/15/21                 Plan - 01/30/21 1744    Clinical Impression Statement Patient presents to clinic with excellent motivation to participate in therapy. Patient demonstrates deficits in PFM coordination, PFM strength, IAP management, pain, and posture. Patient with improved coordination of TrA in supine hooklying but continues to have compensatory recruitment from obliques during today's session and responded positively to quadratus lumborum mm interventions. Patient will benefit from continued skilled therapeutic intervention to address remaining deficits in PFM coordination, PFM strength, IAP management, pain, and posture in order to increase function and improve overall QOL.    Personal Factors and Comorbidities Behavior Pattern;Comorbidity 3+;Fitness;Time since onset of injury/illness/exacerbation;Past/Current Experience    Comorbidities asthma, anxiety, depression    Examination-Activity Limitations Transfers;Sleep;Continence;Caring for Others;Locomotion Level;Other     Examination-Participation Restrictions Interpersonal Relationship;Meal Prep;Shop;Laundry;Cleaning;Community Activity    Stability/Clinical Decision Making Evolving/Moderate complexity    Rehab Potential Good    PT Frequency 1x / week    PT Duration 8 weeks    PT Treatment/Interventions ADLs/Self Care Home Management;Biofeedback;Cryotherapy;Electrical Stimulation;Moist Heat;Therapeutic exercise;Neuromuscular re-education;Patient/family education;Orthotic Fit/Training;Manual techniques;Scar mobilization;Dry needling;Taping;Spinal Manipulations;Joint Manipulations    PT Next Visit Plan lifting mechanics    PT Home Exercise Plan bladder diary    Consulted and Agree with Plan of Care Patient           Patient will benefit from skilled therapeutic intervention in order to improve the following deficits and impairments:  Decreased endurance,Improper body mechanics,Postural dysfunction,Pain,Decreased coordination,Decreased strength,Decreased scar mobility  Visit Diagnosis: Other lack of coordination  Muscle weakness (generalized)  Abnormal posture     Problem List Patient Active Problem List   Diagnosis Date Noted  . Asthma 11/08/2020  . COVID-19 10/26/2020  . History of premature rupture  of membranes 07/04/2020  . History of depression 01/20/2020  . Anxiety 11/18/2019    Class: Chronic  . Depression 06/25/2019   Sheria Lang PT, DPT [phone]  01/30/2021, 6:36 PM  [facility] [address] [place], [place], [address] Phone: [phone]   Fax:  [phone]  Name: Karen Hanna MRN: [id number] Date of Birth: [birth year]

## 2021-02-06 ENCOUNTER — Ambulatory Visit: Payer: BC Managed Care – PPO | Admitting: Physical Therapy

## 2021-02-06 ENCOUNTER — Other Ambulatory Visit: Payer: Self-pay

## 2021-03-07 ENCOUNTER — Ambulatory Visit: Payer: BC Managed Care – PPO | Admitting: Unknown Physician Specialty

## 2021-06-30 ENCOUNTER — Encounter: Payer: Self-pay | Admitting: Internal Medicine

## 2021-06-30 ENCOUNTER — Ambulatory Visit: Payer: BC Managed Care – PPO | Admitting: Internal Medicine

## 2021-06-30 ENCOUNTER — Other Ambulatory Visit: Payer: Self-pay

## 2021-06-30 ENCOUNTER — Ambulatory Visit (INDEPENDENT_AMBULATORY_CARE_PROVIDER_SITE_OTHER): Payer: BC Managed Care – PPO | Admitting: Internal Medicine

## 2021-06-30 VITALS — BP 115/72 | HR 71 | Temp 97.1°F | Resp 18 | Ht 65.0 in | Wt 208.6 lb

## 2021-06-30 DIAGNOSIS — H9201 Otalgia, right ear: Secondary | ICD-10-CM | POA: Diagnosis not present

## 2021-06-30 DIAGNOSIS — R599 Enlarged lymph nodes, unspecified: Secondary | ICD-10-CM

## 2021-06-30 NOTE — Patient Instructions (Signed)
Earache, Adult An earache, or ear pain, can be caused by many things, including: An infection. Ear wax buildup. Ear pressure. Something in the ear that should not be there (foreign body). A sore throat. Tooth problems. Jaw problems. Treatment of the earache will depend on the cause. If the cause is not clear or cannot be determined, you may need to watch your symptoms until your earache goes away or until a cause is found. Follow these instructions at home: Medicines Take or apply over-the-counter and prescription medicines only as told by your health care provider. If you were prescribed an antibiotic medicine, use it as told by your health care provider. Do not stop using the antibiotic even if you start to feel better. Do not put anything in your ear other than medicine that is prescribed by your health care provider. Managing pain If directed, apply heat to the affected area as often as told by your health care provider. Use the heat source that your health care provider recommends, such as a moist heat pack or a heating pad. Place a towel between your skin and the heat source. Leave the heat on for 20-30 minutes. Remove the heat if your skin turns bright red. This is especially important if you are unable to feel pain, heat, or cold. You may have a greater risk of getting burned. If directed, put ice on the affected area as often as told by your health care provider. To do this:   Put ice in a plastic bag. Place a towel between your skin and the bag. Leave the ice on for 20 minutes, 2-3 times a day. General instructions Pay attention to any changes in your symptoms. Try resting in an upright position instead of lying down. This may help to reduce pressure in your ear and relieve pain. Chew gum if it helps to relieve your ear pain. Treat any allergies as told by your health care provider. Drink enough fluid to keep your urine pale yellow. It is up to you to get the results of any  tests that were done. Ask your health care provider, or the department that is doing the tests, when your results will be ready. Keep all follow-up visits as told by your health care provider. This is important. Contact a health care provider if: Your pain does not improve within 2 days. Your earache gets worse. You have new symptoms. You have a fever. Get help right away if you: Have a severe headache. Have a stiff neck. Have trouble swallowing. Have redness or swelling behind your ear. Have fluid or blood coming from your ear. Have hearing loss. Feel dizzy. Summary An earache, or ear pain, can be caused by many things. Treatment of the earache will depend on the cause. Follow recommendations from your health care provider to treat your ear pain. If the cause is not clear or cannot be determined, you may need to watch your symptoms until your earache goes away or until a cause is found. Keep all follow-up visits as told by your health care provider. This is important. This information is not intended to replace advice given to you by your health care provider. Make sure you discuss any questions you have with your health care provider. Document Revised: 04/25/2019 Document Reviewed: 04/25/2019 Elsevier Patient Education  2022 Elsevier Inc.  

## 2021-06-30 NOTE — Progress Notes (Signed)
Subjective:    Patient ID: Karen Hanna, female    DOB: 27-Aug-1991, 30 y.o.   MRN: [id number]  HPI  Pt presents to clinic today with complaint of right ear pain.  She reports this started about 1-2 weeks ago.  She describes the pain as stabbing only when she yawns or swallows.  She had URI symptoms prior to the onset of the ear pain but reports those have resolved.  She denies headache, runny nose, nasal congestion, sore throat, cough.  She denies fever, chills or body aches.  She has taken Sudafed OTC with minimal relief of symptoms. She reports she has had swollen lymph nodes in her knees bother her from time to time.  She has seen providers in the past who told her it would go down over time and that it was not concerning however she continues to be concerned about it.  She would like a referral to ENT for further evaluation.  Review of Systems     Past Medical History:  Diagnosis Date   Depression 06/25/2019    Current Outpatient Medications  Medication Sig Dispense Refill   albuterol (VENTOLIN HFA) 108 (90 Base) MCG/ACT inhaler Inhale 2 puffs into the lungs every 6 (six) hours as needed for wheezing or shortness of breath. 8 g 0   cyclobenzaprine (FLEXERIL) 10 MG tablet Take 0.5-1 tablets (5-10 mg total) by mouth 3 (three) times daily as needed for muscle spasms. 30 tablet 2   mometasone-formoterol (DULERA) 200-5 MCG/ACT AERO Inhale 2 puffs into the lungs in the morning and at bedtime. 1 each 1   Prenatal Vit-Fe Fumarate-FA (PRENATAL VITAMINS PO) Take by mouth.     No current facility-administered medications for this visit.    No Known Allergies  Family History  Problem Relation Age of Onset   Depression Mother    Hypertension Mother    Alcoholism Father    Mental illness Father    Hypertension Brother    Hypertension Maternal Grandfather    Stroke Maternal Grandfather     Social History   Socioeconomic History   Marital status: Married    Spouse name: Not on file    Number of children: Not on file   Years of education: Not on file   Highest education level: Not on file  Occupational History   Not on file  Tobacco Use   Smoking status: Former    Years: 11.00    Types: Cigarettes    Quit date: 01/28/2018    Years since quitting: 3.4   Smokeless tobacco: Never  Vaping Use   Vaping Use: Never used  Substance and Sexual Activity   Alcohol use: Not Currently    Alcohol/week: 1.0 standard drink    Types: 1 Glasses of wine per week   Drug use: Never   Sexual activity: Yes    Birth control/protection: None, Other-see comments    Comment: breastfeeding  Other Topics Concern   Not on file  Social History Narrative   Not on file   Social Determinants of Health   Financial Resource Strain: Not on file  Food Insecurity: Not on file  Transportation Needs: Not on file  Physical Activity: Not on file  Stress: Not on file  Social Connections: Not on file  Intimate Partner Violence: Not on file     Constitutional: Denies fever, malaise, fatigue, headache or abrupt weight changes.  HEENT: Patient reports ear pain swollen glands.  Denies eye pain, eye redness, ringing in the ears,  wax buildup, runny nose, nasal congestion, bloody nose, or sore throat. Respiratory: Denies difficulty breathing, shortness of breath, cough or sputum production.   Cardiovascular: Denies chest pain, chest tightness, palpitations or swelling in the hands or feet.  Skin: Denies redness, rashes, lesions or ulcercations.   No other specific complaints in a complete review of systems (except as listed in HPI above).  Objective:   Physical Exam  BP 115/72 (BP Location: Right Arm, Patient Position: Sitting, Cuff Size: Normal)   Pulse 71   Temp (!) 97.1 F (36.2 C) (Temporal)   Resp 18   Ht 5\' 5"  (1.651 m)   Wt 208 lb 9.6 oz (94.6 kg)   SpO2 99%   BMI 34.71 kg/m   Wt Readings from Last 3 Encounters:  01/04/21 215 lb 3.2 oz (97.6 kg)  12/12/20 224 lb 3.2 oz (101.7  kg)  11/29/20 233 lb (105.7 kg)    General: Appears her stated age, obese, in NAD. Skin: Warm, dry and intact. No rashes noted. HEENT: Head: normal shape and size;  Ears: Tm's gray and intact, normal light reflex, no pain with pulling on the pinna, no pain with palpation of the tragus; Throat/Mouth: Teeth present, mucosa pink and moist, no exudate, lesions or ulcerations noted.  Neck: No adenopathy noted however she reports pain with palpation of the right submandibular and anterior cervical lymph nodes. Cardiovascular: Normal rate. Pulmonary/Chest: Normal effort . BMET    Component Value Date/Time   NA 137 06/25/2019 1003   K 3.9 06/25/2019 1003   CL 104 06/25/2019 1003   CO2 24 06/25/2019 1003   GLUCOSE 84 06/25/2019 1003   BUN 8 06/25/2019 1003   CREATININE 0.86 06/25/2019 1003   CALCIUM 9.6 06/25/2019 1003   GFRNONAA >60 05/27/2018 0921   GFRAA >60 05/27/2018 0921    Lipid Panel     Component Value Date/Time   CHOL 188 06/25/2019 1003   TRIG 136 06/25/2019 1003   HDL 47 (L) 06/25/2019 1003   CHOLHDL 4.0 06/25/2019 1003   LDLCALC 116 (H) 06/25/2019 1003    CBC    Component Value Date/Time   WBC 8.0 12/12/2020 1435   WBC 12.3 (H) 07/06/2020 0514   RBC 4.82 12/12/2020 1435   RBC 3.23 (L) 07/06/2020 0514   HGB 14.2 12/12/2020 1435   HCT 42.6 12/12/2020 1435   PLT 226 12/12/2020 1435   MCV 88 12/12/2020 1435   MCH 29.5 12/12/2020 1435   MCH 30.3 07/06/2020 0514   MCHC 33.3 12/12/2020 1435   MCHC 34.9 07/06/2020 0514   RDW 13.1 12/12/2020 1435   LYMPHSABS 2.5 12/17/2019 1435   EOSABS 0.1 12/17/2019 1435   BASOSABS 0.0 12/17/2019 1435    Hgb A1C Lab Results  Component Value Date   HGBA1C 4.8 12/17/2019            Assessment & Plan:   Right Ear Pain, Swollen Glands:  Exam benign, no evidence of tonsillitis, otitis media Offered steroid injection to see if that would help with the sensation of the swelling glands- she declines Referral to ENT for  further evaluation per patient request  Return precautions discussed  12/19/2019, NP This visit occurred during the SARS-CoV-2 public health emergency.  Safety protocols were in place, including screening questions prior to the visit, additional usage of staff PPE, and extensive cleaning of exam room while observing appropriate contact time as indicated for disinfecting solutions.

## 2021-07-05 ENCOUNTER — Encounter: Payer: Self-pay | Admitting: Certified Nurse Midwife

## 2021-07-05 ENCOUNTER — Ambulatory Visit (INDEPENDENT_AMBULATORY_CARE_PROVIDER_SITE_OTHER): Payer: BC Managed Care – PPO | Admitting: Certified Nurse Midwife

## 2021-07-05 ENCOUNTER — Other Ambulatory Visit (HOSPITAL_COMMUNITY)
Admission: RE | Admit: 2021-07-05 | Discharge: 2021-07-05 | Disposition: A | Discharge status: Home / Self Care | Payer: BC Managed Care – PPO | Source: Ambulatory Visit | Attending: Certified Nurse Midwife | Admitting: Certified Nurse Midwife

## 2021-07-05 ENCOUNTER — Other Ambulatory Visit: Payer: Self-pay

## 2021-07-05 VITALS — BP 109/74 | HR 6 | Ht 65.0 in | Wt 207.0 lb

## 2021-07-05 DIAGNOSIS — Z124 Encounter for screening for malignant neoplasm of cervix: Secondary | ICD-10-CM

## 2021-07-05 DIAGNOSIS — Z01419 Encounter for gynecological examination (general) (routine) without abnormal findings: Secondary | ICD-10-CM | POA: Diagnosis present

## 2021-07-05 DIAGNOSIS — Z113 Encounter for screening for infections with a predominantly sexual mode of transmission: Secondary | ICD-10-CM | POA: Diagnosis not present

## 2021-07-05 DIAGNOSIS — Z13220 Encounter for screening for lipoid disorders: Secondary | ICD-10-CM | POA: Diagnosis not present

## 2021-07-05 DIAGNOSIS — R102 Pelvic and perineal pain: Secondary | ICD-10-CM

## 2021-07-05 DIAGNOSIS — Z8742 Personal history of other diseases of the female genital tract: Secondary | ICD-10-CM

## 2021-07-05 NOTE — Patient Instructions (Signed)
Preventive Care 21-28 Years Old, Female Preventive care refers to lifestyle choices and visits with your health care provider that can promote health and wellness. This includes: A yearly physical exam. This is also called an annual wellness visit. Regular dental and eye exams. Immunizations. Screening for certain conditions. Healthy lifestyle choices, such as: Eating a healthy diet. Getting regular exercise. Not using drugs or products that contain nicotine and tobacco. Limiting alcohol use. What can I expect for my preventive care visit? Physical exam Your health care provider may check your: Height and weight. These may be used to calculate your BMI (body mass index). BMI is a measurement that tells if you are at a healthy weight. Heart rate and blood pressure. Body temperature. Skin for abnormal spots. Counseling Your health care provider may ask you questions about your: Past medical problems. Family's medical history. Alcohol, tobacco, and drug use. Emotional well-being. Home life and relationship well-being. Sexual activity. Diet, exercise, and sleep habits. Work and work environment. Access to firearms. Method of birth control. Menstrual cycle. Pregnancy history. What immunizations do I need? Vaccines are usually given at various ages, according to a schedule. Your health care provider will recommend vaccines for you based on your age, medical history, and lifestyle or other factors, such as travel or where you work. What tests do I need? Blood tests Lipid and cholesterol levels. These may be checked every 5 years starting at age 20. Hepatitis C test. Hepatitis B test. Screening Diabetes screening. This is done by checking your blood sugar (glucose) after you have not eaten for a while (fasting). STD (sexually transmitted disease) testing, if you are at risk. BRCA-related cancer screening. This may be done if you have a family history of breast, ovarian, tubal, or  peritoneal cancers. Pelvic exam and Pap test. This may be done every 3 years starting at age 21. Starting at age 30, this may be done every 5 years if you have a Pap test in combination with an HPV test. Talk with your health care provider about your test results, treatment options, and if necessary, the need for more tests. Follow these instructions at home: Eating and drinking  Eat a healthy diet that includes fresh fruits and vegetables, whole grains, lean protein, and low-fat dairy products. Take vitamin and mineral supplements as recommended by your health care provider. Do not drink alcohol if: Your health care provider tells you not to drink. You are pregnant, may be pregnant, or are planning to become pregnant. If you drink alcohol: Limit how much you have to 0-1 drink a day. Be aware of how much alcohol is in your drink. In the [place] one drink equals one 12 oz bottle of beer (355 mL), one 5 oz glass of wine (148 mL), or one 1 oz glass of hard liquor (44 mL). Lifestyle Take daily care of your teeth and gums. Brush your teeth every morning and night with fluoride toothpaste. Floss one time each day. Stay active. Exercise for at least 30 minutes 5 or more days each week. Do not use any products that contain nicotine or tobacco, such as cigarettes, e-cigarettes, and chewing tobacco. If you need help quitting, ask your health care provider. Do not use drugs. If you are sexually active, practice safe sex. Use a condom or other form of protection to prevent STIs (sexually transmitted infections). If you do not wish to become pregnant, use a form of birth control. If you plan to become pregnant, see your health care provider   for a prepregnancy visit. Find healthy ways to cope with stress, such as: Meditation, yoga, or listening to music. Journaling. Talking to a trusted person. Spending time with friends and family. Safety Always wear your seat belt while driving or riding in a  vehicle. Do not drive: If you have been drinking alcohol. Do not ride with someone who has been drinking. When you are tired or distracted. While texting. Wear a helmet and other protective equipment during sports activities. If you have firearms in your house, make sure you follow all gun safety procedures. Seek help if you have been physically or sexually abused. What's next? Go to your health care provider once a year for an annual wellness visit. Ask your health care provider how often you should have your eyes and teeth checked. Stay up to date on all vaccines. This information is not intended to replace advice given to you by your health care provider. Make sure you discuss any questions you have with your health care provider. Document Revised: 11/25/2020 Document Reviewed: 05/29/2018 Elsevier Patient Education  2022 Elsevier Inc.  

## 2021-07-05 NOTE — Progress Notes (Signed)
GYNECOLOGY ANNUAL PREVENTATIVE CARE ENCOUNTER NOTE  History:     Karen Hanna is a 30 y.o. G7P1011 female here for a routine annual gynecologic exam.  Current complaints: Pelvic pain on and of lower right and left quadrant . Pt has history of ovarian cysts.   Denies abnormal vaginal bleeding, discharge, pelvic pain, problems with intercourse or other gynecologic concerns.     Social Relationship: Married Living: with children and patner Work: Exercise: occasional  Smoke/Alcohol/drug use: Denies use.   Gynecologic History No LMP recorded (lmp unknown). Contraception: condoms Last Pap: 06/25/19. Results were: normal with negative HPV Last mammogram: n/a .    The pregnancy intention screening data noted above was reviewed. Potential methods of contraception were discussed. The patient elected to proceed with condoms.   Obstetric History OB History  Gravida Para Term Preterm AB Living  2 1 1   1 1   SAB IAB Ectopic Multiple Live Births        0 1    # Outcome Date GA Lbr Len/2nd Weight Sex Delivery Anes PTL Lv  2 Term 07/05/20 [redacted]w[redacted]d 11:10 / 00:45 7 lb 9 oz (3.43 kg) F Vag-Spont EPI  LIV  1 AB 2011            Past Medical History:  Diagnosis Date   Depression 06/25/2019    Past Surgical History:  Procedure Laterality Date   APPENDECTOMY      Current Outpatient Medications on File Prior to Visit  Medication Sig Dispense Refill   albuterol (VENTOLIN HFA) 108 (90 Base) MCG/ACT inhaler Inhale 2 puffs into the lungs every 6 (six) hours as needed for wheezing or shortness of breath. 8 g 0   mometasone-formoterol (DULERA) 200-5 MCG/ACT AERO Inhale 2 puffs into the lungs in the morning and at bedtime. 1 each 1   No current facility-administered medications on file prior to visit.    No Known Allergies  Social History:  reports that she quit smoking about 3 years ago. She has never used smokeless tobacco. She reports that she does not currently use alcohol after a past  usage of about 1.0 standard drink per week. She reports that she does not use drugs.  Family History  Problem Relation Age of Onset   Depression Mother    Hypertension Mother    Alcoholism Father    Mental illness Father    Hypertension Brother    Hypertension Maternal Grandfather    Stroke Maternal Grandfather     The following portions of the patient's history were reviewed and updated as appropriate: allergies, current medications, past family history, past medical history, past social history, past surgical history and problem list.  Review of Systems Pertinent items noted in HPI and remainder of comprehensive ROS otherwise negative.  Physical Exam:  BP 109/74   Pulse (!) 6   Ht 5\' 5"  (1.651 m)   Wt 207 lb (93.9 kg)   LMP  (LMP Unknown)   Breastfeeding No   BMI 34.45 kg/m  CONSTITUTIONAL: Well-developed, well-nourished female in no acute distress.  HENT:  Normocephalic, atraumatic, External right and left ear normal. Oropharynx is clear and moist EYES: Conjunctivae and EOM are normal. Pupils are equal, round, and reactive to light. No scleral icterus.  NECK: Normal range of motion, supple, no masses.  Normal thyroid.  SKIN: Skin is warm and dry. No rash noted. Not diaphoretic. No erythema. No pallor. MUSCULOSKELETAL: Normal range of motion. No tenderness.  No cyanosis, clubbing, or edema.  2+ distal pulses. NEUROLOGIC: Alert and oriented to person, place, and time. Normal reflexes, muscle tone coordination.  PSYCHIATRIC: Normal mood and affect. Normal behavior. Normal judgment and thought content. CARDIOVASCULAR: Normal heart rate noted, regular rhythm RESPIRATORY: Clear to auscultation bilaterally. Effort and breath sounds normal, no problems with respiration noted. BREASTS: Symmetric in size. No masses, tenderness, skin changes, nipple drainage, or lymphadenopathy bilaterally.  ABDOMEN: Soft, no distention noted.  No tenderness, rebound or guarding.  PELVIC: Normal  appearing external genitalia and urethral meatus; normal appearing vaginal mucosa and cervix.  No abnormal discharge noted.  Pap smear obtained.  Normal uterine size, no other palpable masses, no uterine or adnexal tenderness.  .   Assessment and Plan:    1. Well woman exam with routine gynecological exam  - Hepatitis C Antibody - Lipid Profile - Cytology - PAP  2. Screening for cervical cancer - Cytology - PAP  3. Screening examination for STD (sexually transmitted disease)  - Hepatitis C Antibody  4. Screening cholesterol level - Lipid Profile  5. Pelvic pain - US PELVIC COMPLETE WITH TRANSVAGINAL; Future  6. History of ovarian cyst - US PELVIC COMPLETE WITH TRANSVAGINAL; Future   Pap:Will follow up results of pap smear and manage accordingly. Pt has history of HPV and request to repeat pap this year. Mammogram : not indicated Labs:  Hep C, Lipid  Refills: none Orders: u/s pelvic for pelvic pain  Referral: none Routine preventative health maintenance measures emphasized. Please refer to After Visit Summary for other counseling recommendations.      Doreene Burke, CNM [facility],  [facility]

## 2021-07-06 LAB — LIPID PANEL
Chol/HDL Ratio: 5.3 ratio — ABNORMAL HIGH (ref 0.0–4.4)
Cholesterol, Total: 200 mg/dL — ABNORMAL HIGH (ref 100–199)
HDL: 38 mg/dL — ABNORMAL LOW (ref >39)
LDL Chol Calc (NIH): 122 mg/dL — ABNORMAL HIGH (ref 0–99)
Triglycerides: 224 mg/dL — ABNORMAL HIGH (ref 0–149)
VLDL Cholesterol Cal: 40 mg/dL (ref 5–40)

## 2021-07-06 LAB — HEPATITIS C ANTIBODY: Hep C Virus Ab: <0.1 s/co ratio (ref 0.0–0.9)

## 2021-07-10 LAB — CYTOLOGY - PAP: Diagnosis: NEGATIVE

## 2021-07-28 ENCOUNTER — Telehealth: Payer: Self-pay | Admitting: Internal Medicine

## 2021-07-28 NOTE — Telephone Encounter (Signed)
Pt is calling to ask can FMLA paperwork be completed for her. Pt was referred to neurology on 01/04/21 -  Neurology is stating that PCP need to complete paperwork. Would an appt be needed to discuss prior to paperwork to be completed.  During OV Pt was told possible carpal tunnel as most symptoms provoked by repetitive hand/wrist activity and reproduced symptoms are classic for carpal tunnel but also involves 4th 5th fingers as well. No prior clear diagnosis or treatment, now presenting to medical care. Request nerve conduction study evaluation and treatment. Also she has low back pain with similar sciatica vs paresthesia on L side.    Please advise CB- [phone]

## 2021-07-31 NOTE — Telephone Encounter (Signed)
I called and left a detail message on the patient vm that she need to schedule an appt to address her FMLA.

## 2021-07-31 NOTE — Telephone Encounter (Signed)
Yes, she needs to schedule an appt

## 2021-08-10 ENCOUNTER — Emergency Department
Admission: EM | Admit: 2021-08-10 | Discharge: 2021-08-10 | Disposition: A | Discharge status: Home / Self Care | Payer: BC Managed Care – PPO | Attending: Emergency Medicine | Admitting: Emergency Medicine

## 2021-08-10 ENCOUNTER — Encounter: Payer: Self-pay | Admitting: Internal Medicine

## 2021-08-10 ENCOUNTER — Emergency Department: Payer: BC Managed Care – PPO

## 2021-08-10 ENCOUNTER — Other Ambulatory Visit: Payer: Self-pay

## 2021-08-10 DIAGNOSIS — Z7951 Long term (current) use of inhaled steroids: Secondary | ICD-10-CM | POA: Diagnosis not present

## 2021-08-10 DIAGNOSIS — J45909 Unspecified asthma, uncomplicated: Secondary | ICD-10-CM | POA: Insufficient documentation

## 2021-08-10 DIAGNOSIS — R0789 Other chest pain: Secondary | ICD-10-CM | POA: Insufficient documentation

## 2021-08-10 DIAGNOSIS — Z87891 Personal history of nicotine dependence: Secondary | ICD-10-CM | POA: Diagnosis not present

## 2021-08-10 LAB — CBC
HCT: 46 % (ref 36.0–46.0)
Hemoglobin: 15.2 g/dL — ABNORMAL HIGH (ref 12.0–15.0)
MCH: 29 pg (ref 26.0–34.0)
MCHC: 33 g/dL (ref 30.0–36.0)
MCV: 87.6 fL (ref 80.0–100.0)
Platelets: 285 10*3/uL (ref 150–400)
RBC: 5.25 MIL/uL — ABNORMAL HIGH (ref 3.87–5.11)
RDW: 13.1 % (ref 11.5–15.5)
WBC: 7.9 10*3/uL (ref 4.0–10.5)
nRBC: 0 % (ref 0.0–0.2)

## 2021-08-10 LAB — TROPONIN I (HIGH SENSITIVITY): Troponin I (High Sensitivity): 3 ng/L (ref <18)

## 2021-08-10 LAB — BASIC METABOLIC PANEL
Anion gap: 8 (ref 5–15)
BUN: 15 mg/dL (ref 6–20)
CO2: 24 mmol/L (ref 22–32)
Calcium: 9.7 mg/dL (ref 8.9–10.3)
Chloride: 106 mmol/L (ref 98–111)
Creatinine, Ser: 0.89 mg/dL (ref 0.44–1.00)
GFR, Estimated: >60 mL/min (ref >60)
Glucose, Bld: 99 mg/dL (ref 70–99)
Potassium: 4 mmol/L (ref 3.5–5.1)
Sodium: 138 mmol/L (ref 135–145)

## 2021-08-10 NOTE — Discharge Instructions (Signed)
Please try bland foods without any spice as well as no caffeine or alcohol for the next 48 hours.  If this helps, please use famotidine up to 40 mg once a day for reflux symptoms

## 2021-08-10 NOTE — ED Triage Notes (Signed)
Pt here with CP that started on 07/20/21 but has gotten worse over time. Pt states pain is left sided and radiates to her left arm and back. Pt in NAD in triage.

## 2021-08-10 NOTE — ED Provider Notes (Signed)
[facility] Emergency Department Provider Note   ____________________________________________   Event Date/Time   First MD Initiated Contact with Patient 08/10/21 1133     (approximate)  I have reviewed the triage vital signs and the nursing notes.  HISTORY  Chief Complaint Chest Pain    HPI Karen Hanna is a 30 y.o. female who presents for left-sided chest pain  LOCATION: Left sternal border DURATION: Began last night and has been stable since onset TIMING: Since onset SEVERITY: Moderate QUALITY: Burning/sharp pain CONTEXT: Patient states she has had intermittent symptoms like this in the past however this time lasted for longer than normal of left parasternal burning/sharp chest pain that radiates to her shoulder and through to her back MODIFYING FACTORS: Worsened on taking a deep breath and partially relieved at rest ASSOCIATED SYMPTOMS: Pain radiates to left shoulder and in through to back   Per medical record review, patient has history of anxiety/depression          Past Medical History:  Diagnosis Date   Depression 06/25/2019    Patient Active Problem List   Diagnosis Date Noted   Asthma 11/08/2020   Anxiety 11/18/2019    Class: Chronic   Depression 06/25/2019    Past Surgical History:  Procedure Laterality Date   APPENDECTOMY      Prior to Admission medications   Medication Sig Start Date End Date Taking? Authorizing Provider  albuterol (VENTOLIN HFA) 108 (90 Base) MCG/ACT inhaler Inhale 2 puffs into the lungs every 6 (six) hours as needed for wheezing or shortness of breath. 10/26/20   Malfi, Jodelle Gross, FNP  mometasone-formoterol (DULERA) 200-5 MCG/ACT AERO Inhale 2 puffs into the lungs in the morning and at bedtime. 11/01/20   Gabriel Cirri, NP    Allergies Patient has no known allergies.  Family History  Problem Relation Age of Onset   Depression Mother    Hypertension Mother    Alcoholism Father    Mental illness  Father    Hypertension Brother    Hypertension Maternal Grandfather    Stroke Maternal Grandfather     Social History Social History   Tobacco Use   Smoking status: Former    Years: 11.00    Types: Cigarettes    Quit date: 01/28/2018    Years since quitting: 3.5   Smokeless tobacco: Never  Vaping Use   Vaping Use: Never used  Substance Use Topics   Alcohol use: Not Currently    Alcohol/week: 1.0 standard drink    Types: 1 Glasses of wine per week   Drug use: Never    Review of Systems Constitutional: No fever/chills Eyes: No visual changes. ENT: No sore throat. Cardiovascular: Endorses chest pain. Respiratory: Denies shortness of breath. Gastrointestinal: No abdominal pain.  No nausea, no vomiting.  No diarrhea. Genitourinary: Negative for dysuria. Musculoskeletal: Negative for acute arthralgias Skin: Negative for rash. Neurological: Negative for headaches, weakness/numbness/paresthesias in any extremity Psychiatric: Negative for suicidal ideation/homicidal ideation   ____________________________________________   PHYSICAL EXAM:  VITAL SIGNS: ED Triage Vitals  Enc Vitals Group     BP 08/10/21 1124 123/75     Pulse Rate 08/10/21 1124 90     Resp 08/10/21 1124 18     Temp 08/10/21 1124 98.2 F (36.8 C)     Temp Source 08/10/21 1124 Oral     SpO2 08/10/21 1124 99 %     Weight 08/10/21 1121 210 lb (95.3 kg)     Height 08/10/21 1121 5'  5" (1.651 m)     Head Circumference --      Peak Flow --      Pain Score 08/10/21 1120 5     Pain Loc --      Pain Edu? --      Excl. in GC? --    Constitutional: Alert and oriented. Well appearing and in no acute distress. Eyes: Conjunctivae are normal. PERRL. Head: Atraumatic. Nose: No congestion/rhinnorhea. Mouth/Throat: Mucous membranes are moist. Neck: No stridor Cardiovascular: Grossly normal heart sounds.  Good peripheral circulation. Respiratory: Normal respiratory effort.  No retractions. Gastrointestinal: Soft  and nontender. No distention. Musculoskeletal: No obvious deformities Neurologic:  Normal speech and language. No gross focal neurologic deficits are appreciated. Skin:  Skin is warm and dry. No rash noted. Psychiatric: Mood and affect are normal. Speech and behavior are normal.  ____________________________________________   LABS (all labs ordered are listed, but only abnormal results are displayed)  Labs Reviewed  CBC - Abnormal; Notable for the following components:      Result Value   RBC 5.25 (*)    Hemoglobin 15.2 (*)    All other components within normal limits  BASIC METABOLIC PANEL  POC URINE PREG, ED  TROPONIN I (HIGH SENSITIVITY)  TROPONIN I (HIGH SENSITIVITY)   ____________________________________________  EKG  ED ECG REPORT I, Merwyn Katos, the attending physician, personally viewed and interpreted this ECG.  Date: 08/10/2021 EKG Time: 1121 Rate: 85 Rhythm: normal sinus rhythm QRS Axis: normal Intervals: normal ST/T Wave abnormalities: normal Narrative Interpretation: no evidence of acute ischemia  ____________________________________________  RADIOLOGY  ED MD interpretation: 2 view chest x-ray shows no evidence of acute abnormalities including no pneumonia, pneumothorax, or widened mediastinum  Official radiology report(s): DG Chest 2 View  Result Date: 08/10/2021 CLINICAL DATA:  Chest pain EXAM: CHEST - 2 VIEW COMPARISON:  11/01/2020 FINDINGS: The heart size and mediastinal contours are within normal limits. Both lungs are clear. The visualized skeletal structures are unremarkable. IMPRESSION: No active cardiopulmonary disease. Electronically Signed   By: Ernie Avena M.D.   On: 08/10/2021 12:13    ____________________________________________   PROCEDURES  Procedure(s) performed (including Critical Care):  .1-3 Lead EKG Interpretation Performed by: Merwyn Katos, MD Authorized by: Merwyn Katos, MD     Interpretation: normal      ECG rate:  88   ECG rate assessment: normal     Rhythm: sinus rhythm     Ectopy: none     Conduction: normal     ____________________________________________   INITIAL IMPRESSION / ASSESSMENT AND PLAN / ED COURSE  As part of my medical decision making, I reviewed the following data within the electronic medical record, if available:  Nursing notes reviewed and incorporated, Labs reviewed, EKG interpreted, Old chart reviewed, Radiograph reviewed and Notes from prior ED visits reviewed and incorporated        Workup: ECG, CXR, CBC, BMP, Troponin Findings: ECG: No overt evidence of STEMI. No evidence of Brugadas sign, delta wave, epsilon wave, significantly prolonged QTc, or malignant arrhythmia HS Troponin: Negative x1 Other Labs unremarkable for emergent problems. CXR: Without PTX, PNA, or widened mediastinum Last Stress Test: Never Last Heart Catheterization: Never HEART Score: 2  Given History, Exam, and Workup I have low suspicion for ACS, Pneumothorax, Pneumonia, Pulmonary Embolus, Tamponade, Aortic Dissection or other emergent problem as a cause for this presentation.   Reassesment: Prior to discharge patients pain was controlled and they were well appearing.  Disposition:  Discharge.  Strict return precautions discussed with patient with full understanding. Advised patient to follow up promptly with primary care provider       ____________________________________________   FINAL CLINICAL IMPRESSION(S) / ED DIAGNOSES  Final diagnoses:  Atypical chest pain     ED Discharge Orders     None        Note:  This document was prepared using Dragon voice recognition software and may include unintentional dictation errors.    Merwyn Katos, MD 08/10/21 [phone]

## 2021-12-14 ENCOUNTER — Encounter: Payer: BC Managed Care – PPO | Admitting: Certified Nurse Midwife

## 2022-11-05 IMAGING — DX DG CHEST 2V
2 series · 2 of 2 positions shown · non-contrast
Comparison: 05/27/2018

CLINICAL DATA: Ongoing cough, shortness of breath, and pain. Recent
[id number] infection.

EXAM:
CHEST - 2 VIEW

[chest lat]
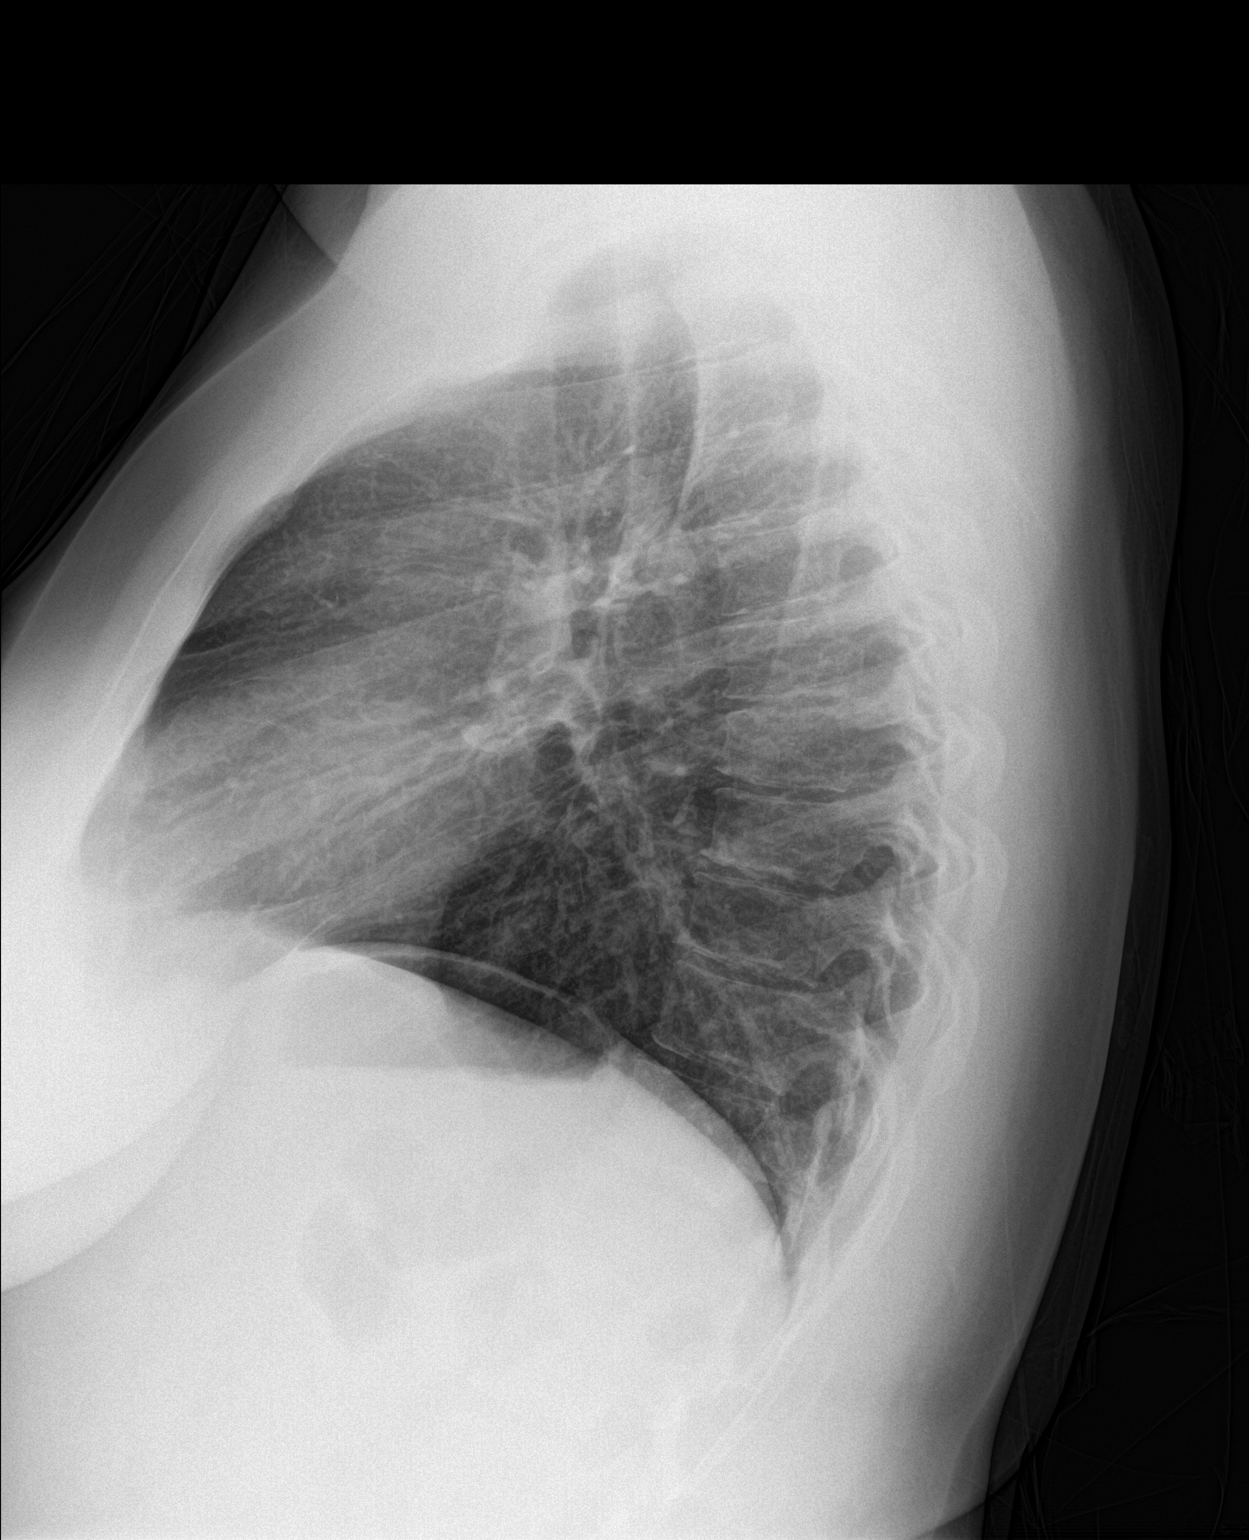

[chest pa]
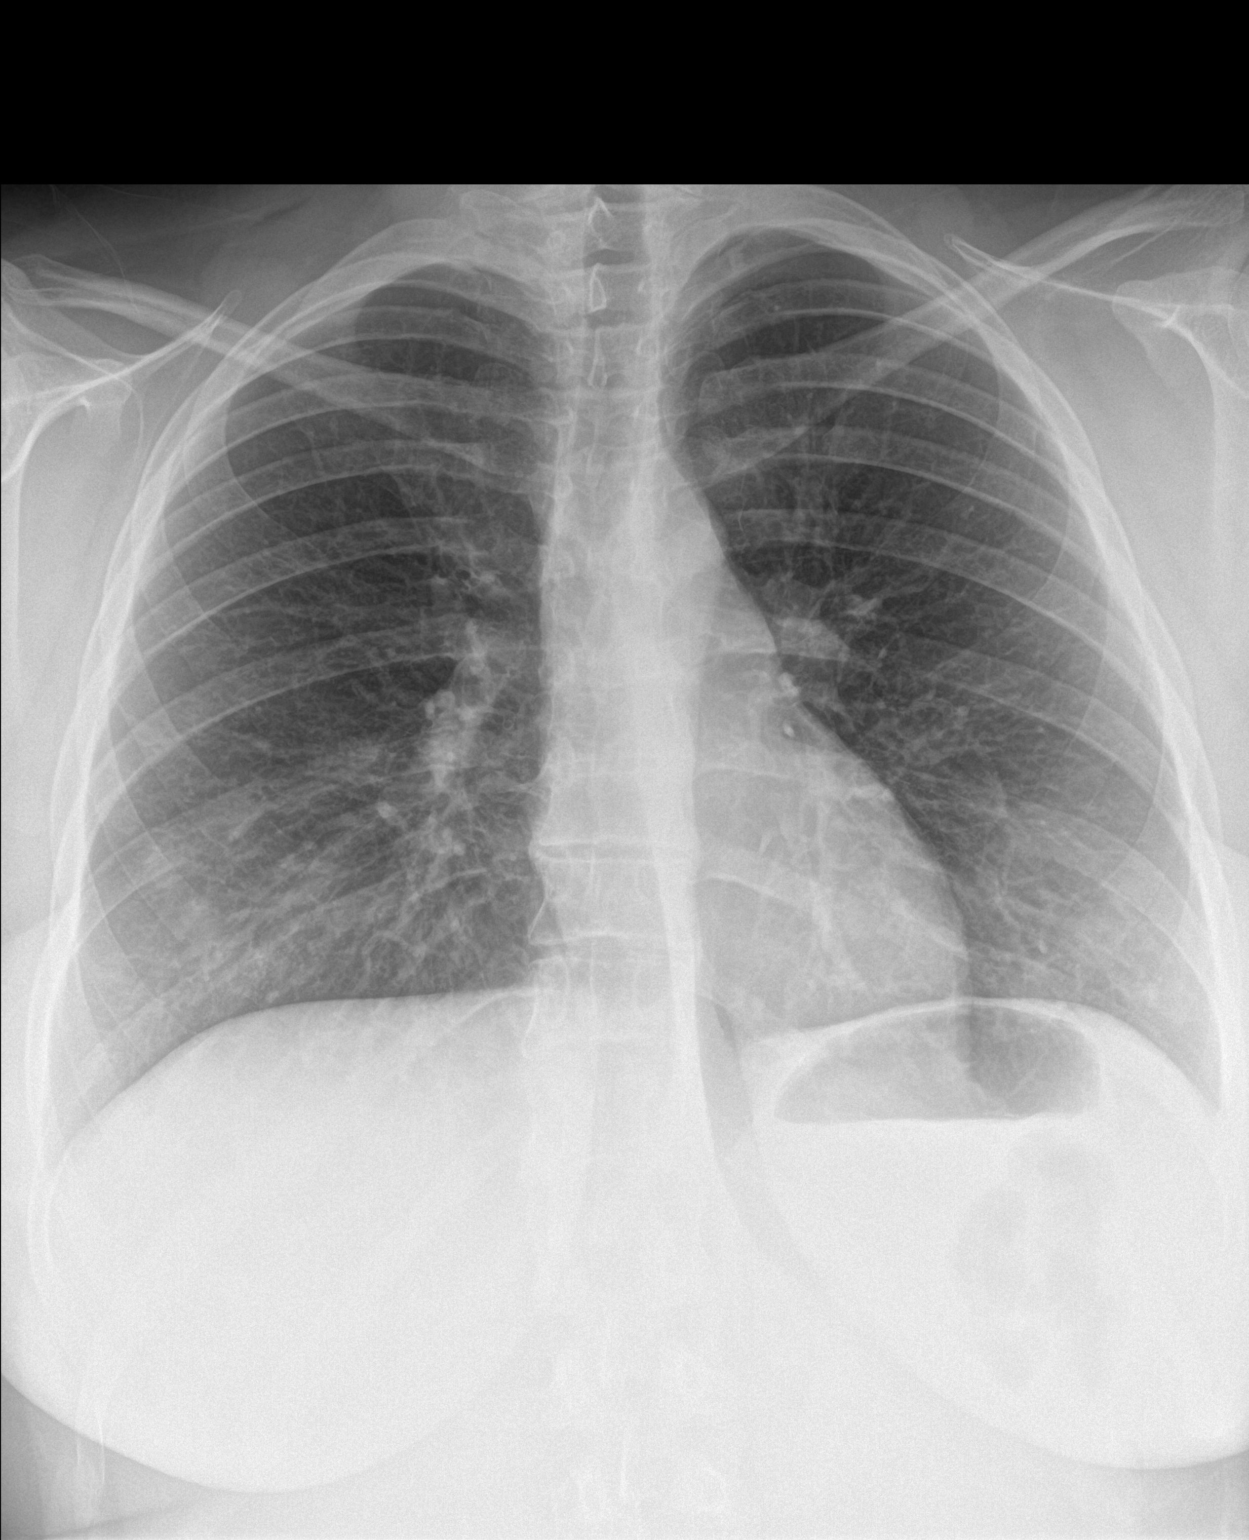

[2 of 2 positions shown; findings below may reference images not displayed]

FINDINGS: The cardiomediastinal silhouette is unchanged with normal heart
size. The lungs are well inflated. There are mildly increased
interstitial markings in both lung bases compared to the prior
study. No confluent airspace opacity, pleural effusion, pneumothorax
is identified. No acute osseous abnormality is seen.
IMPRESSION: Mild interstitial densities in the lung bases which could reflect
atypical/viral infection.
